# Patient Record
Sex: Female | Born: 1950 | Race: Black or African American | Hispanic: No | Smoking: Never smoker
Health system: Southern US, Community
[De-identification: ages and names within clinical notes are randomized; demographics above are authoritative.]

## PROBLEM LIST (undated history)

## (undated) DIAGNOSIS — E049 Nontoxic goiter, unspecified: Secondary | ICD-10-CM

## (undated) DIAGNOSIS — R001 Bradycardia, unspecified: Secondary | ICD-10-CM

## (undated) DIAGNOSIS — I10 Essential (primary) hypertension: Secondary | ICD-10-CM

## (undated) HISTORY — PX: TUBAL LIGATION: SHX77

## (undated) HISTORY — PX: CHOLECYSTECTOMY: SHX55

## (undated) HISTORY — PX: TONSILLECTOMY: SUR1361

---

## 2001-05-26 ENCOUNTER — Inpatient Hospital Stay (HOSPITAL_COMMUNITY): Admission: EM | Admit: 2001-05-26 | Discharge: 2001-05-28 | Payer: Self-pay | Admitting: Emergency Medicine

## 2001-05-26 ENCOUNTER — Encounter (INDEPENDENT_AMBULATORY_CARE_PROVIDER_SITE_OTHER): Payer: Self-pay | Admitting: *Deleted

## 2001-05-26 ENCOUNTER — Emergency Department (HOSPITAL_COMMUNITY): Admission: EM | Admit: 2001-05-26 | Discharge: 2001-05-26 | Payer: Self-pay | Admitting: Emergency Medicine

## 2001-05-26 ENCOUNTER — Encounter: Payer: Self-pay | Admitting: Internal Medicine

## 2006-02-22 ENCOUNTER — Encounter: Admission: RE | Admit: 2006-02-22 | Discharge: 2006-02-22 | Payer: Self-pay | Admitting: Internal Medicine

## 2007-05-08 ENCOUNTER — Encounter: Admission: RE | Admit: 2007-05-08 | Discharge: 2007-05-08 | Payer: Self-pay | Admitting: Internal Medicine

## 2008-05-08 ENCOUNTER — Encounter: Admission: RE | Admit: 2008-05-08 | Discharge: 2008-05-08 | Payer: Self-pay | Admitting: Internal Medicine

## 2009-05-26 ENCOUNTER — Encounter: Admission: RE | Admit: 2009-05-26 | Discharge: 2009-05-26 | Payer: Self-pay | Admitting: Gynecology

## 2010-05-27 ENCOUNTER — Encounter: Admission: RE | Admit: 2010-05-27 | Discharge: 2010-05-27 | Payer: Self-pay | Admitting: Gynecology

## 2011-01-29 NOTE — H&P (Signed)
[place]. [facility]  Patient:    Anna Gutierrez, Anna Gutierrez Visit Number: [id number] MRN: [id number]          Service Type: MED Location: [id number] [phone] Attending Physician:  Wilson Singer Dictated by:   Jimmye Norman, M.D. Admit Date:  05/26/2001   CC:         Lilly Cove, M.D.   History and Physical  PATIENT IDENTIFICATION AND CHIEF COMPLAINT:  The patient is a 60 year old woman with acute cholecystitis.  HISTORY OF PRESENT ILLNESS:  The patient was admitted by Dr. Karilyn Cota after an ultrasound done because of acute right upper quadrant pain demonstrated acute cholecystitis with a dilated common bile duct and gallstones.  A surgical consultation was obtained.  PAST MEDICAL HISTORY:  Hypertension for which she takes hydrochlorothiazide and potassium   supplement.  In spite of that, the patient is hypokalemic at 2.7.  PAST SURGICAL HISTORY:  She has had a tubal ligation.  That is all that she can remember otherwise.  REVIEW OF SYSTEMS:  She is healthy.  She has had no diarrhea, constipation, no jaundice.  PHYSICAL EXAMINATION:  VITAL SIGNS:  She is currently afebrile.  Other vital signs are stable.  Her white count is elevated at 13,800, hematocrit 42%.  Potassium 2.7.  ABDOMEN:  Soft.  She has tenderness in her right upper quadrant which is improved from previously when she was initially seen.  She has mild guarding but no rebound, no peritonitis.  IMPRESSION: 1. Acute cholecystitis and cholelithiasis. 2. Hypokalemia.  PLAN:  Replace her potassium and prepare her for surgical intervention tomorrow. Dictated by:   Jimmye Norman, M.D. Attending Physician:  Wilson Singer DD:  05/26/01 TD:  05/26/01 Job: [id number]

## 2011-01-29 NOTE — H&P (Signed)
[name]. [facility]  Patient:    Anna Gutierrez, Anna Gutierrez Visit Number: [id number] MRN: [id number]          Service Type: Attending:  Lilly Cove, M.D. Dictated by:   Lilly Cove, M.D.                           History and Physical  DATE OF BIRTH:  April 25, 1931.  HISTORY OF PRESENT ILLNESS:  This is a 60 year old African-American lady who has a background of hypertension who presented with a one-day history of epigastric pain which radiated more to the left side of the abdomen and upper chest.  She went to the emergency room yesterday but waited for four to five hours without being seen by a physician and decided that she should come home. She came to my office this morning and presented with these symptoms. There was no hematemesis or melena.  There was no nausea or vomiting.  PAST MEDICAL HISTORY:  Hypertension as mentioned above.  No serious operations or illnesses.  MEDICATIONS: 1. Hydrochlorothiazide 50 mg q.d. 2. Potassium chloride 14 mEq b.i.d.  SOCIAL HISTORY:  She does not smoke and does not drink excessive alcohol.  FAMILY HISTORY:  Noncontributory.  REVIEW OF SYSTEMS:  Apart from the symptoms mentioned above, there are no other symptoms referable to the cardiovascular, respiratory, musculoskeletal, neurologic, endocrine, skin, or genitourinary systems.  PHYSICAL EXAMINATION:  GENERAL APPEARANCE:  She does not look jaundice.  She does not look clinically anemic.  VITAL SIGNS:  She is afebrile.  Blood pressure 104/80 in the office, pulse 80 per minute.  LUNGS:  Clear.  CARDIOVASCULAR:  Heart sounds are present and normal with no murmurs or added sounds.  ABDOMEN:  She is tender in the right upper quadrant with positive Murphys sign.  Her abdomen is otherwise soft but bowel sounds are scanty.  NEUROLOGICAL:  Unremarkable and there were no focal neurological signs.  INVESTIGATIONS:  An ultrasound of the abdomen has been done in  the ultrasound department at [facility] and the verbal report is one of acute cholecystitis with cholelithiasis and dilatation of biliary duct system.  IMPRESSION AND PLAN:  Acute cholecystitis and gallstones.  We will keep her NPO, give her intravenous fluids and intravenous analgesia as well as antiemetics as required.  Intravenous Unasyn 1.5 g q.6h. will be started. Further lab work is pending including an amylase and lipase.  Blood cultures will be obtained.  I have asked the surgeons to come and consult on her as she will very likely need a cholecystectomy.  Further recommendations will dependent on the patients progress. Dictated by:   Lilly Cove, M.D. Attending:  Lilly Cove, M.D. DD:  05/26/01 TD:  05/26/01 Job: [id number]

## 2011-01-29 NOTE — Op Note (Signed)
[place]. [facility]  Patient:    LEMOYNE, SCARPATI Visit Number: [id number] MRN: [id number]          Service Type: MED Location: 5700 [phone] Attending Physician:  Wilson Singer Dictated by:   Jimmye Norman, M.D. Proc. Date: 05/27/01 Admit Date:  05/26/2001   CC:         Lilly Cove, M.D.   Operative Report  PREOPERATIVE DIAGNOSIS:  Acute cholecystitis and cholelithiasis.  POSTOPERATIVE DIAGNOSIS:  Acute cholecystitis and cholelithiasis.  PROCEDURE:  Laparoscopic cholecystectomy.  SURGEON:  Jimmye Norman, M.D.  ASSISTANT:  Sandria Bales. Ezzard Standing, M.D.  ANESTHESIA:  General endotracheal.  ESTIMATED BLOOD LOSS:  100 to 200 cc.  COMPLICATIONS:  None.  CONDITION:  Stable.  INDICATIONS FOR PROCEDURE:  The patient is a 60 year old woman with hypertension, otherwise healthy.  Comes in with acute right upper quadrant pain, and ultrasound demonstrating acute cholecystitis.  FINDINGS:  The patient had an acutely inflamed gallbladder with adhesions to the surrounding omentum, duodenum, and stomach.  No cholangiogram was done. There was thick bile and large stones contained within the gallbladder itself which was markedly distended and likely with an obstructed cystic duct.  DESCRIPTION OF PROCEDURE:  The patient was taken to the operating room, placed on the table in supine position.  After an adequate general anesthetic was administered, she was prepped and draped in usual sterile manner with Duraprep and placed exposing the right upper quadrant and midline of the abdomen.  The patient was placed in reverse Trendelenburg position.  A supraumbilical incision was made using a #11 blade and taken down to the midline fascia.  It was through this midline fascia that a Veress needle was passed into the perineal cavity while tenting up on the anterior abdominal wall, and confirmed to be in position with the saline tents.  Once this was done, carbon  dioxide insufflation was instilled into the peritoneal cavity up to a maximum intra-abdominal pressure of 15 mmHg.  When this was done a 10-11 mm cannula was passed through the supraumbilical fascia into the peritoneal cavity, and confirmed to be in position with the laparoscope and attached camera and light source.  With that in place, two right costal margins 5 mm cannulas in the subxiphoid, a 10-11 mm cannula passed into the peritoneal cavity under direct vision.  The patient was placed in steeper reversed Trendelenburg position. Her left side was tilted down, and the dissection begun.  As mentioned previously, there were adhesions to the gallbladder wall, the body, and the infundibulum.  These were taken down using blunt dissection.  We were then able to decompress the gallbladder with a large decompressive needle through the medial 5 mm cannula.  Once this was done, we were able to grasp the gallbladder well and retracted towards the right upper quadrant.  During the process the infundibulum was exposed, ______ separately, and also used it as a point where we could fold from and dissect out the cystic duct and cystic artery in the hepatoduodenal triangle, and the triangle of Fallot.  Both the cystic duct and cystic artery were identified and clipped proximally and distally with leaf double clips.  We then transected both structures, and dissected the gallbladder off of its bed using electrocautery.  This portion was somewhat bloody because the intense inflammation of the gallbladder.  It also should be noted that at her cannula sites, the patient did tend to bleed more excessively than usual from all incisions made in the skin.  Once the gallbladder was removed from its bed, we passed it into an endocatch bag and brought it out through the supraumbilical fascia.  We did have to open the gallbladder to do this.  Once this was done, we allowed all gas to escape from the cannulas after  irrigating with 3 L of warm saline solution.  Once we allowed all gas to escape, we reapproximated the supraumbilical fascia using a figure-of-eight stitch of 0 Vicryl, passed on an UR6 needle.  Marcaine 0.25% with epinephrine was injected in all skin sites.  Once this was done, the skin was reapproximated using a running subcuticular stitch of 4-0 Vicryl.  Sterile dressings were applied. Dictated by:   Jimmye Norman, M.D. Attending Physician:  Wilson Singer DD:  05/27/01 TD:  05/27/01 Job: [id number]

## 2011-04-19 ENCOUNTER — Other Ambulatory Visit: Payer: Self-pay | Admitting: Internal Medicine

## 2011-04-19 DIAGNOSIS — Z1231 Encounter for screening mammogram for malignant neoplasm of breast: Secondary | ICD-10-CM

## 2011-05-31 ENCOUNTER — Ambulatory Visit
Admission: RE | Admit: 2011-05-31 | Discharge: 2011-05-31 | Disposition: A | Discharge status: Home / Self Care | Payer: BC Managed Care – PPO | Source: Ambulatory Visit | Attending: Internal Medicine | Admitting: Internal Medicine

## 2011-05-31 DIAGNOSIS — Z1231 Encounter for screening mammogram for malignant neoplasm of breast: Secondary | ICD-10-CM

## 2012-05-05 ENCOUNTER — Other Ambulatory Visit: Payer: Self-pay | Admitting: Gynecology

## 2012-05-05 DIAGNOSIS — Z1231 Encounter for screening mammogram for malignant neoplasm of breast: Secondary | ICD-10-CM

## 2012-06-01 ENCOUNTER — Ambulatory Visit
Admission: RE | Admit: 2012-06-01 | Discharge: 2012-06-01 | Disposition: A | Discharge status: Home / Self Care | Payer: BC Managed Care – PPO | Source: Ambulatory Visit | Attending: Gynecology | Admitting: Gynecology

## 2012-06-01 DIAGNOSIS — Z1231 Encounter for screening mammogram for malignant neoplasm of breast: Secondary | ICD-10-CM

## 2012-12-07 ENCOUNTER — Other Ambulatory Visit: Payer: Self-pay | Admitting: Gynecology

## 2013-07-06 ENCOUNTER — Other Ambulatory Visit: Payer: Self-pay

## 2013-07-06 DIAGNOSIS — Z1231 Encounter for screening mammogram for malignant neoplasm of breast: Secondary | ICD-10-CM

## 2013-07-26 ENCOUNTER — Ambulatory Visit
Admission: RE | Admit: 2013-07-26 | Discharge: 2013-07-26 | Disposition: A | Discharge status: Home / Self Care | Payer: BC Managed Care – PPO | Source: Ambulatory Visit

## 2013-07-26 DIAGNOSIS — Z1231 Encounter for screening mammogram for malignant neoplasm of breast: Secondary | ICD-10-CM

## 2014-06-27 ENCOUNTER — Other Ambulatory Visit: Payer: Self-pay

## 2014-06-27 DIAGNOSIS — Z1239 Encounter for other screening for malignant neoplasm of breast: Secondary | ICD-10-CM

## 2014-07-26 ENCOUNTER — Other Ambulatory Visit: Payer: Self-pay

## 2014-07-26 DIAGNOSIS — Z1231 Encounter for screening mammogram for malignant neoplasm of breast: Secondary | ICD-10-CM

## 2014-07-29 ENCOUNTER — Ambulatory Visit
Admission: RE | Admit: 2014-07-29 | Discharge: 2014-07-29 | Disposition: A | Discharge status: Home / Self Care | Payer: BC Managed Care – PPO | Source: Ambulatory Visit

## 2014-07-29 ENCOUNTER — Encounter (INDEPENDENT_AMBULATORY_CARE_PROVIDER_SITE_OTHER): Payer: Self-pay

## 2014-07-29 DIAGNOSIS — Z1231 Encounter for screening mammogram for malignant neoplasm of breast: Secondary | ICD-10-CM

## 2014-09-13 HISTORY — PX: RECTAL PROLAPSE REPAIR: SHX759

## 2014-11-16 DIAGNOSIS — K623 Rectal prolapse: Secondary | ICD-10-CM | POA: Insufficient documentation

## 2015-05-22 DIAGNOSIS — R159 Full incontinence of feces: Secondary | ICD-10-CM | POA: Insufficient documentation

## 2015-06-23 ENCOUNTER — Other Ambulatory Visit: Payer: Self-pay

## 2015-06-23 DIAGNOSIS — Z1231 Encounter for screening mammogram for malignant neoplasm of breast: Secondary | ICD-10-CM

## 2015-08-01 ENCOUNTER — Ambulatory Visit
Admission: RE | Admit: 2015-08-01 | Discharge: 2015-08-01 | Disposition: A | Discharge status: Home / Self Care | Payer: BC Managed Care – PPO | Source: Ambulatory Visit

## 2015-08-01 DIAGNOSIS — Z1231 Encounter for screening mammogram for malignant neoplasm of breast: Secondary | ICD-10-CM

## 2016-07-08 ENCOUNTER — Other Ambulatory Visit: Payer: Self-pay | Admitting: Internal Medicine

## 2016-07-08 DIAGNOSIS — Z1231 Encounter for screening mammogram for malignant neoplasm of breast: Secondary | ICD-10-CM

## 2016-08-04 ENCOUNTER — Ambulatory Visit
Admission: RE | Admit: 2016-08-04 | Discharge: 2016-08-04 | Disposition: A | Discharge status: Home / Self Care | Payer: BC Managed Care – PPO | Source: Ambulatory Visit | Attending: Internal Medicine | Admitting: Internal Medicine

## 2016-08-04 DIAGNOSIS — Z1231 Encounter for screening mammogram for malignant neoplasm of breast: Secondary | ICD-10-CM

## 2017-03-16 ENCOUNTER — Observation Stay (HOSPITAL_COMMUNITY): Payer: BC Managed Care – PPO

## 2017-03-16 ENCOUNTER — Encounter (HOSPITAL_COMMUNITY): Payer: Self-pay | Admitting: Emergency Medicine

## 2017-03-16 ENCOUNTER — Inpatient Hospital Stay (HOSPITAL_COMMUNITY)
Admission: EM | Admit: 2017-03-16 | Discharge: 2017-03-18 | DRG: 244 | Disposition: A | Discharge status: Home / Self Care | Payer: BC Managed Care – PPO | Attending: Family Medicine | Admitting: Family Medicine

## 2017-03-16 DIAGNOSIS — R001 Bradycardia, unspecified: Secondary | ICD-10-CM | POA: Diagnosis not present

## 2017-03-16 DIAGNOSIS — I10 Essential (primary) hypertension: Secondary | ICD-10-CM | POA: Diagnosis not present

## 2017-03-16 DIAGNOSIS — R55 Syncope and collapse: Secondary | ICD-10-CM | POA: Diagnosis not present

## 2017-03-16 DIAGNOSIS — Z8249 Family history of ischemic heart disease and other diseases of the circulatory system: Secondary | ICD-10-CM

## 2017-03-16 DIAGNOSIS — Z95818 Presence of other cardiac implants and grafts: Secondary | ICD-10-CM

## 2017-03-16 DIAGNOSIS — E0500 Thyrotoxicosis with diffuse goiter without thyrotoxic crisis or storm: Secondary | ICD-10-CM | POA: Diagnosis present

## 2017-03-16 DIAGNOSIS — R159 Full incontinence of feces: Secondary | ICD-10-CM | POA: Diagnosis present

## 2017-03-16 DIAGNOSIS — I491 Atrial premature depolarization: Secondary | ICD-10-CM | POA: Diagnosis present

## 2017-03-16 DIAGNOSIS — K623 Rectal prolapse: Secondary | ICD-10-CM | POA: Diagnosis present

## 2017-03-16 DIAGNOSIS — Z79899 Other long term (current) drug therapy: Secondary | ICD-10-CM

## 2017-03-16 DIAGNOSIS — I959 Hypotension, unspecified: Secondary | ICD-10-CM | POA: Diagnosis present

## 2017-03-16 DIAGNOSIS — E0590 Thyrotoxicosis, unspecified without thyrotoxic crisis or storm: Secondary | ICD-10-CM

## 2017-03-16 HISTORY — DX: Bradycardia, unspecified: R00.1

## 2017-03-16 HISTORY — DX: Nontoxic goiter, unspecified: E04.9

## 2017-03-16 HISTORY — DX: Essential (primary) hypertension: I10

## 2017-03-16 LAB — BASIC METABOLIC PANEL
Anion gap: 9 (ref 5–15)
BUN: 7 mg/dL (ref 6–20)
CALCIUM: 9.6 mg/dL (ref 8.9–10.3)
CO2: 23 mmol/L (ref 22–32)
CREATININE: 0.79 mg/dL (ref 0.44–1.00)
Chloride: 107 mmol/L (ref 101–111)
GFR calc Af Amer: >60 mL/min (ref >60)
GLUCOSE: 106 mg/dL — AB (ref 65–99)
POTASSIUM: 4.3 mmol/L (ref 3.5–5.1)
Sodium: 139 mmol/L (ref 135–145)

## 2017-03-16 LAB — CBC WITH DIFFERENTIAL/PLATELET
Basophils Absolute: 0 10*3/uL (ref 0.0–0.1)
Basophils Relative: 0 %
EOS ABS: 0.2 10*3/uL (ref 0.0–0.7)
EOS PCT: 2 %
HCT: 42.6 % (ref 36.0–46.0)
Hemoglobin: 14 g/dL (ref 12.0–15.0)
LYMPHS ABS: 1.8 10*3/uL (ref 0.7–4.0)
LYMPHS PCT: 19 %
MCH: 28.3 pg (ref 26.0–34.0)
MCHC: 32.9 g/dL (ref 30.0–36.0)
MCV: 86.2 fL (ref 78.0–100.0)
MONO ABS: 0.8 10*3/uL (ref 0.1–1.0)
MONOS PCT: 9 %
Neutro Abs: 6.6 10*3/uL (ref 1.7–7.7)
Neutrophils Relative %: 70 %
PLATELETS: 302 10*3/uL (ref 150–400)
RBC: 4.94 MIL/uL (ref 3.87–5.11)
RDW: 13.8 % (ref 11.5–15.5)
WBC: 9.3 10*3/uL (ref 4.0–10.5)

## 2017-03-16 LAB — I-STAT TROPONIN, ED: Troponin i, poc: 0 ng/mL (ref 0.00–0.08)

## 2017-03-16 LAB — SEDIMENTATION RATE: SED RATE: 26 mm/h — AB (ref 0–22)

## 2017-03-16 LAB — URINALYSIS, ROUTINE W REFLEX MICROSCOPIC
Bilirubin Urine: NEGATIVE
GLUCOSE, UA: NEGATIVE mg/dL
Hgb urine dipstick: NEGATIVE
Ketones, ur: NEGATIVE mg/dL
LEUKOCYTES UA: NEGATIVE
Nitrite: NEGATIVE
PH: 6 (ref 5.0–8.0)
PROTEIN: NEGATIVE mg/dL
Specific Gravity, Urine: 1.008 (ref 1.005–1.030)

## 2017-03-16 LAB — RAPID URINE DRUG SCREEN, HOSP PERFORMED
Amphetamines: NOT DETECTED
BARBITURATES: NOT DETECTED
BENZODIAZEPINES: NOT DETECTED
COCAINE: NOT DETECTED
Opiates: NOT DETECTED
Tetrahydrocannabinol: NOT DETECTED

## 2017-03-16 LAB — TSH

## 2017-03-16 LAB — MAGNESIUM: MAGNESIUM: 2.2 mg/dL (ref 1.7–2.4)

## 2017-03-16 LAB — T4, FREE: Free T4: 1.81 ng/dL — ABNORMAL HIGH (ref 0.61–1.12)

## 2017-03-16 LAB — POC OCCULT BLOOD, ED: Fecal Occult Bld: NEGATIVE

## 2017-03-16 LAB — TROPONIN I
Troponin I: <0.03 ng/mL (ref <0.03)
Troponin I: <0.03 ng/mL (ref <0.03)

## 2017-03-16 MED ORDER — BENAZEPRIL HCL 10 MG PO TABS
10.0000 mg | ORAL_TABLET | Freq: Every day | ORAL | Status: DC
  Filled 2017-03-16 (×2): qty 1

## 2017-03-16 MED ORDER — ONDANSETRON HCL 4 MG PO TABS: 4.0000 mg | ORAL_TABLET | Freq: Four times a day (QID) | ORAL | Status: DC | PRN

## 2017-03-16 MED ORDER — SODIUM CHLORIDE 0.9% FLUSH
3.0000 mL | Freq: Two times a day (BID) | INTRAVENOUS | Status: DC
  Administered 2017-03-16 – 2017-03-18 (×5): 3 mL via INTRAVENOUS

## 2017-03-16 MED ORDER — ACETAMINOPHEN 325 MG PO TABS
650.0000 mg | ORAL_TABLET | Freq: Four times a day (QID) | ORAL | Status: DC | PRN
  Administered 2017-03-16: 650 mg via ORAL
  Filled 2017-03-16: qty 2

## 2017-03-16 MED ORDER — ENOXAPARIN SODIUM 40 MG/0.4ML ~~LOC~~ SOLN
40.0000 mg | SUBCUTANEOUS | Status: DC
  Administered 2017-03-16: 40 mg via SUBCUTANEOUS
  Filled 2017-03-16: qty 0.4

## 2017-03-16 MED ORDER — AMLODIPINE BESY-BENAZEPRIL HCL 5-10 MG PO CAPS: 1.0000 | ORAL_CAPSULE | Freq: Every day | ORAL | Status: DC

## 2017-03-16 MED ORDER — ACETAMINOPHEN 650 MG RE SUPP: 650.0000 mg | Freq: Four times a day (QID) | RECTAL | Status: DC | PRN

## 2017-03-16 MED ORDER — DOBUTAMINE IN D5W 4-5 MG/ML-% IV SOLN
2.5000 ug/kg/min | INTRAVENOUS | Status: DC
  Administered 2017-03-16: 2.5 ug/kg/min via INTRAVENOUS
  Filled 2017-03-16: qty 250

## 2017-03-16 MED ORDER — SODIUM CHLORIDE 0.9 % IV BOLUS (SEPSIS)
1000.0000 mL | Freq: Once | INTRAVENOUS | Status: AC
  Administered 2017-03-16: 1000 mL via INTRAVENOUS

## 2017-03-16 MED ORDER — SODIUM CHLORIDE 0.9 % IV SOLN
INTRAVENOUS | Status: DC
  Administered 2017-03-16: 12:00:00 via INTRAVENOUS

## 2017-03-16 MED ORDER — ONDANSETRON HCL 4 MG/2ML IJ SOLN: 4.0000 mg | Freq: Four times a day (QID) | INTRAMUSCULAR | Status: DC | PRN

## 2017-03-16 MED ORDER — AMLODIPINE BESYLATE 5 MG PO TABS
5.0000 mg | ORAL_TABLET | Freq: Every day | ORAL | Status: DC
  Filled 2017-03-16: qty 1

## 2017-03-16 NOTE — ED Provider Notes (Signed)
[facility]-EMERGENCY DEPT Provider Note   CSN: [id number] Arrival date & time: 03/16/17  0815     History   Chief Complaint Chief Complaint  Patient presents with  . Dizziness  . Weakness    HPI Anna Gutierrez is a 66 y.o. female.  HPI  Patient presents to ED for complaints of lightheadedness and generalized weakness for the past 2 days that has worsened this morning. She states that when she was walking her mailbox she is unsure if she lost consciousness or hit her head. She also reports taking a home Hemoccult stool test 1 month ago which came out positive. She states that she is scheduled to see a GI specialist in September and could not get in any earlier. She denies any gross blood in her stools, abdominal pain, nausea or vomiting. She states she is compliant with her amlodipine and vitamin D daily. Denies any recent medication changes or additions. She denies any numbness, weakness or trouble walking.  History reviewed. No pertinent past medical history.  There are no active problems to display for this patient.   History reviewed. No pertinent surgical history.  OB History    No data available       Home Medications    Prior to Admission medications   Not on File    Family History History reviewed. No pertinent family history.  Social History Social History  Substance Use Topics  . Smoking status: Never Smoker  . Smokeless tobacco: Never Used  . Alcohol use No     Allergies   Patient has no known allergies.   Review of Systems Review of Systems  Constitutional: Negative for appetite change, chills and fever.  HENT: Negative for ear pain, rhinorrhea, sneezing and sore throat.   Eyes: Negative for photophobia and visual disturbance.  Respiratory: Negative for cough, chest tightness, shortness of breath and wheezing.   Cardiovascular: Negative for chest pain and palpitations.  Gastrointestinal: Positive for blood in stool. Negative for abdominal pain,  constipation, diarrhea, nausea and vomiting.  Genitourinary: Negative for dysuria, hematuria and urgency.  Musculoskeletal: Negative for myalgias.  Skin: Negative for rash.  Neurological: Positive for dizziness, weakness and light-headedness. Negative for facial asymmetry, speech difficulty, numbness and headaches.     Physical Exam Updated Vital Signs BP (!) 142/62 (BP Location: Left Arm)   Pulse 74   Temp 98.2 F (36.8 C) (Oral)   Resp 16   Ht 5\' 5"  (1.651 m)   Wt 72.1 kg (159 lb)   SpO2 99%   BMI 26.46 kg/m   Physical Exam  Constitutional: She is oriented to person, place, and time. She appears well-developed and well-nourished. No distress.  HENT:  Head: Normocephalic and atraumatic.  Nose: Nose normal.  Eyes: Conjunctivae and EOM are normal. Pupils are equal, round, and reactive to light. Right eye exhibits no discharge. Left eye exhibits no discharge. No scleral icterus.  Neck: Normal range of motion. Neck supple.  Cardiovascular: Normal rate, regular rhythm, normal heart sounds and intact distal pulses.  Exam reveals no gallop and no friction rub.   No murmur heard. Pulmonary/Chest: Effort normal and breath sounds normal. No respiratory distress.  Abdominal: Soft. Bowel sounds are normal. She exhibits no distension. There is no tenderness. There is no guarding.  Genitourinary: Rectum normal. Rectal exam shows no fissure and guaiac negative stool.  Genitourinary Comments: Chaperone present throughout entirety of rectal exam.  Musculoskeletal: Normal range of motion. She exhibits no edema.  Neurological: She is alert  and oriented to person, place, and time. No cranial nerve deficit or sensory deficit. She exhibits normal muscle tone. Coordination normal.  Strength 5/5 in bilateral upper and lower extremities. Cranial nerves appear grossly intact. Negative HINTS exam. No facial asymmetry noted. 2+ DP pulses and radial pulses bilaterally.   Skin: Skin is warm and dry. No rash  noted.  Psychiatric: She has a normal mood and affect.  Nursing note and vitals reviewed.    ED Treatments / Results  Labs (all labs ordered are listed, but only abnormal results are displayed) Labs Reviewed  CBC WITH DIFFERENTIAL/PLATELET  BASIC METABOLIC PANEL  POC OCCULT BLOOD, ED  I-STAT TROPOININ, ED    EKG  EKG Interpretation  Date/Time:  Wednesday March 16 2017 09:26:43 EDT Ventricular Rate:  35 PR Interval:    QRS Duration: 105 QT Interval:  517 QTC Calculation: 395 R Axis:   75 Text Interpretation:  Sinus bradycardia Atrial premature complexes No old tracing to compare Confirmed by Doug Sou [phone]) on 03/16/2017 9:32:10 AM       Radiology No results found.  Procedures Procedures (including critical care time)  Medications Ordered in ED Medications  sodium chloride 0.9 % bolus 1,000 mL (1,000 mLs Intravenous New Bag/Given 03/16/17 0945)     Initial Impression / Assessment and Plan / ED Course  I have reviewed the triage vital signs and the nursing notes.  Pertinent labs & imaging results that were available during my care of the patient were reviewed by me and considered in my medical decision making (see chart for details).     Patient presents to ED for lightheadedness and generalized weakness that began 2 days ago and has worsened this morning. Patient reports brief episode of what appears to be loss of consciousness while walking to her mailbox earlier today, unsure of head injury or loss of consciousness. States that she was scheduled for her colonoscopy last month but instead elected to do at home Hemoccult testing which returned as positive. She is scheduled for her GI appointment in September. On physical exam patient appears pleasant and does not appear in acute distress. There are no focal findings on neurological exam. She has no abdominal tenderness. Vital signs appear normal with the exception of heart rate. Patient placed on 5-lead monitor due  to bradycardia down to the 30s to 40s with irregular rhythm. She denies any previous history of heart issues or arrhythmias. She reports compliance with her home amlodipine and vitamin D. She denies any other home medication use. CBC, BMP unremarkable. Troponin negative 1. Hemoccult stool test negative here in the ED. There are no physical findings suggestive of fissure or tear on physical exam. She will need to be admitted for further evaluation and possible pacemaker placement as warranted. Spoke to hospitalist who will admit the patient. Spoke to Dr. Tenny Craw from cardiology who recommended that we obtain TSH and admit patient for further management. I appreciate both of their help for this patient.  Patient discussed with and seen by Dr. Ethelda Chick.  Final Clinical Impressions(s) / ED Diagnoses   Final diagnoses:  None    New Prescriptions New Prescriptions   No medications on file     Dietrich Pates, PA-C 03/16/17 1738    Doug Sou, MD 03/16/17 1739

## 2017-03-16 NOTE — Progress Notes (Signed)
Pt ambulated in a hallway without having any difficulty and SOB

## 2017-03-16 NOTE — ED Notes (Addendum)
Pt. Placed on 5-lead due to bradycardia on pulse ox. Pt. Brady at 30 bpm with irregular rhythm. EKG shot. EDP made aware.   RN attempted 1x IV in right forearm unsuccessfully.

## 2017-03-16 NOTE — Significant Event (Signed)
Patient had a 3.39 s pause. Currently stable. She was asymptomatic. Will monitor. If repeat pause, will need to start on dopamin/dobutamine drip.

## 2017-03-16 NOTE — Progress Notes (Signed)
Cardiology paged informed that patient has had several pauses. The greatest was 3.5 New orders received. Ilean SkillVeronica Estle Huguley LPN

## 2017-03-16 NOTE — H&P (Signed)
History and Physical    Anna Gutierrez:[id number] DOB: 02/13/1951 DOA: 03/16/2017   PCP: Ralene Ok, MD   Patient coming from:  Home    Chief Complaint: Dizziness, generalized weakness  HPI: Anna Gutierrez is a 66 y.o. female  with a history of hypertension, rectal prolapse followed at [facility], presenting to the ED with 2 day history of generalized weakness, lightheadedness with one episode of presyncope as she was to reach an object on the floor. She denies syncope. Denies any vision changes, headaches. No dysarthria or dysphagia. Denies any unilateral weakness or sensory deficiencies. Denies head trauma, confusion or seizures. Denies any chest pain, palpitations, or shortness of breath. Denies any fever or chills, or night sweats. Denies any abdominal pain. She has chronic incontinence of stool  Denies any sick contacts or new foods. Denies any recent long distance trips. No recent surgeries. Denies lower extremity swelling. Denies abnormal skin rashes, or neuropathy. Compliant with her medications. Last BP meds taken last night. No tobacco, alcohol or recreational drugs. She has family history of cardiac disease in her mother. She has never been seen by cardiology. Of note, her PCP had mentioned to her that she had "skipped heartbeats" during her last exam as OP  ED Course:  BP 97/65   Pulse (!) 51   Temp 98.2 F (36.8 C) (Oral)   Resp 17   Ht 5\' 5"  (1.651 m)   Wt 72.1 kg (159 lb)   SpO2 96%   BMI 26.46 kg/m    CBC and CMET normal  CO2 23 received 1 L 05 fluids at the ER EKG SBrady with atrial premature complexes Trop 0  Review of Systems:  As per HPI otherwise all other systems reviewed and are negative  History reviewed. No pertinent past medical history.  History reviewed. No pertinent surgical history.  Social History Social History   Social History  . Marital status: Married    Spouse name: N/A  . Number of children: N/A  . Years of education: N/A    Occupational History  . Not on file.   Social History Main Topics  . Smoking status: Never Smoker  . Smokeless tobacco: Never Used  . Alcohol use No  . Drug use: No  . Sexual activity: Not on file   Other Topics Concern  . Not on file   Social History Narrative  . No narrative on file     No Known Allergies  History reviewed. No pertinent family history.    Prior to Admission medications   Medication Sig Start Date End Date Taking? Authorizing Provider  amLODipine-benazepril (LOTREL) 5-10 MG capsule Take 1 capsule by mouth daily. 01/18/17  Yes [provider]  Cholecalciferol (VITAMIN D PO) Take 1 tablet by mouth daily.   Yes [provider]  Loperamide HCl (IMODIUM A-D PO) Take 1 capsule by mouth daily as needed (loose stool).   Yes [provider]    Physical Exam:  Vitals:   03/16/17 1000 03/16/17 1015 03/16/17 1040 03/16/17 1100  BP: (!) 112/57 114/62  97/65  Pulse: 64 67 (!) 53 (!) 51  Resp: 18 17 19 17   Temp:      TempSrc:      SpO2: 100% 97% 98% 96%  Weight:      Height:       Constitutional: NAD, calm, comfortable   Eyes: PERRL, lids and conjunctivae normal ENMT: Mucous membranes are moist, without exudate or lesions  Neck: normal, supple, no  masses, no thyromegaly Respiratory: clear to auscultation bilaterally, no wheezing, no crackles. Normal respiratory effort  Cardiovascular: Huston FoleyBrady rate and rhythm with intermittent premature beats , 2/6 systolic murmurs, rubs or gallops. No extremity edema. 2+ pedal pulses. No carotid bruits.  Abdomen: Soft, non tender, No hepatosplenomegaly. Bowel sounds positive.  Musculoskeletal: no clubbing / cyanosis. Moves all extremities Skin: no jaundice, No lesions.  Neurologic: Sensation intact  Strength equal in all extremities Psychiatric:   Alert and oriented x 3. Normal mood.     Labs on Admission: I have personally reviewed following labs and imaging studies  CBC:  Recent Labs Lab  03/16/17 0937  WBC 9.3  NEUTROABS 6.6  HGB 14.0  HCT 42.6  MCV 86.2  PLT 302    Basic Metabolic Panel:  Recent Labs Lab 03/16/17 0937  NA 139  K 4.3  CL 107  CO2 23  GLUCOSE 106*  BUN 7  CREATININE 0.79  CALCIUM 9.6  MG 2.2    GFR: Estimated Creatinine Clearance: 70.7 mL/min (by C-G formula based on SCr of 0.79 mg/dL).  Liver Function Tests: No results for input(s): AST, ALT, ALKPHOS, BILITOT, PROT, ALBUMIN in the last 168 hours. No results for input(s): LIPASE, AMYLASE in the last 168 hours. No results for input(s): AMMONIA in the last 168 hours.  Coagulation Profile: No results for input(s): INR, PROTIME in the last 168 hours.  Cardiac Enzymes: No results for input(s): CKTOTAL, CKMB, CKMBINDEX, TROPONINI in the last 168 hours.  BNP (last 3 results) No results for input(s): PROBNP in the last 8760 hours.  HbA1C: No results for input(s): HGBA1C in the last 72 hours.  CBG: No results for input(s): GLUCAP in the last 168 hours.  Lipid Profile: No results for input(s): CHOL, HDL, LDLCALC, TRIG, CHOLHDL, LDLDIRECT in the last 72 hours.  Thyroid Function Tests: No results for input(s): TSH, T4TOTAL, FREET4, T3FREE, THYROIDAB in the last 72 hours.  Anemia Panel: No results for input(s): VITAMINB12, FOLATE, FERRITIN, TIBC, IRON, RETICCTPCT in the last 72 hours.  Urine analysis: No results found for: COLORURINE, APPEARANCEUR, LABSPEC, PHURINE, GLUCOSEU, HGBUR, BILIRUBINUR, KETONESUR, PROTEINUR, UROBILINOGEN, NITRITE, LEUKOCYTESUR  Sepsis Labs: @LABRCNTIP (procalcitonin:4,lacticidven:4) )No results found for this or any previous visit (from the past 240 hour(s)).   Radiological Exams on Admission: X-ray Chest Pa And Lateral  Result Date: 03/16/2017 CLINICAL DATA:  Patient with lightheadedness.  Generalized weakness. EXAM: CHEST  2 VIEW COMPARISON:  None. FINDINGS: Monitoring leads overlie the patient. Normal cardiac and mediastinal contours. No consolidative  pulmonary opacities. No pleural effusion or pneumothorax. Thoracic spine degenerative changes. IMPRESSION: No acute cardiopulmonary process. Electronically Signed   By: Annia Beltrew  Davis M.D.   On: 03/16/2017 11:22    EKG: Independently reviewed.  Assessment/Plan Active Problems:   Symptomatic bradycardia   Pre-syncope   Hypertension      Presyncope likely due to symptomatic bradycardia . Labs normal, EKG SB with premature atrial complex. TN 0 Neuro exam unremarkable. No changes in her meds  Afebrile  Syncope order set  -admit for observation  - Tele bed. - EEG -2 D echo IV fluids at 100 cc/h  [facility] Serial EKG  CXR   Check Magnesium UDS TSH and T4 Cycle troponin Cardiology consult   Hypertension BP 97/65   Pulse 51  Lotrel's last dose last evening . Currently borderline hypotensive  Continue home anti-hypertensive medications tonight unless BP continues to be low  IVF 100 cc/h    DVT prophylaxis: Lovenox  Code Status:  Full     Family Communication:  Discussed with patient Disposition Plan: Expect patient to be discharged to home after condition improves Consults called:    Cardiology as per EDP  Admission status:Tele   Obs    [facility], PA-C [facility]   03/16/2017, 11:25 AM

## 2017-03-16 NOTE — Progress Notes (Signed)
Cardiology informed that patient had a 3.39 second pause patient was asymptomatic bp 108/43 hr 51. Patient HR has dropped in the upper 30's instructed to call if pauses greater than 3 seconds. Ilean SkillVeronica Sharonlee Nine LPN

## 2017-03-16 NOTE — Consult Note (Signed)
Primary Physician: Primary Cardiologist:  New  Asked to see by Dr Montez Moritaarter for bradycardia    HPI:  Pt is a 66 yo with history of HTN  Presents to ED today with dizziness, weakness, possible syncope The pt says this spring she has had a couple episodes of weakness , dizziness  Didn't think much of it   May be a little more tired   Weakness "comes in waves"   Yesterday she felt dizzy all day  Didn't do a lot  Would use hands to move around house  Laid down  [place] thinking it would pass Today felt dizzy while getting ready in [place]  Had to sit on commode  Got up  Again, used furniture around house to get around   She went out to get paper  Bent over  Then found herself on ground  Think she must have passed out for a short bit  Went back inside  Called Dr QUALCOMMMoriera's office  Told to come t oED   Pt had a friend drive her  On way over had to prop up head at times    FHx is positive for CAD in mother     EKG with SB with PACs  35 bpm        Past Medical History:  Diagnosis Date  . Hypertension   . Symptomatic bradycardia 03/16/2017  . Thyroid goiter    had radioactive iodine for treatment    Medications Prior to Admission  Medication Sig Dispense Refill  . amLODipine-benazepril (LOTREL) 5-10 MG capsule Take 1 capsule by mouth daily.    . Cholecalciferol (VITAMIN D PO) Take 1 tablet by mouth daily.    . Loperamide HCl (IMODIUM A-D PO) Take 1 capsule by mouth daily as needed (loose stool).       Marland Kitchen. amLODipine  5 mg Oral Daily   Or  . benazepril  10 mg Oral Daily  . enoxaparin (LOVENOX) injection  40 mg Subcutaneous Q24H  . sodium chloride flush  3 mL Intravenous Q12H    Infusions: . sodium chloride 100 mL/hr at 03/16/17 1208    No Known Allergies  Social History   Social History  . Marital status: Married    Spouse name: N/A  . Number of children: N/A  . Years of education: N/A   Occupational History  . Not on file.   Social History Main Topics  . Smoking  status: Never Smoker  . Smokeless tobacco: Never Used  . Alcohol use No  . Drug use: No  . Sexual activity: Not on file   Other Topics Concern  . Not on file   Social History Narrative  . No narrative on file    History reviewed. No pertinent family history.  REVIEW OF SYSTEMS:  All systems reviewed  Negative to the above problem except as noted above.    PHYSICAL EXAM: Vitals:   03/16/17 1126 03/16/17 1145  BP:  133/60  Pulse: 63 66  Resp: 16 16  Temp:  98.3 F (36.8 C)     Intake/Output Summary (Last 24 hours) at 03/16/17 1423 Last data filed at 03/16/17 1147  Gross per 24 hour  Intake                0 ml  Output              200 ml  Net             -200  ml    General:  Well appearing. No respiratory difficulty HEENT: normal Neck: supple. no JVD. Carotids 2+ bilat; no bruits. No lymphadenopathy or thryomegaly appreciated. Cor: PMI nondisplaced. Regular rate & rhythm. No rubs, gallops or murmurs. Lungs: clear Abdomen: soft, nontender, nondistended. No hepatosplenomegaly. No bruits or masses. Good bowel sounds. Extremities: no cyanosis, clubbing, rash, edema Neuro: alert & oriented x 3, cranial nerves grossly intact. moves all 4 extremities w/o difficulty. Affect pleasant.  ECG:  Noted above    Results for orders placed or performed during the hospital encounter of 03/16/17 (from the past 24 hour(s))  CBC with Differential     Status: None   Collection Time: 03/16/17  9:37 AM  Result Value Ref Range   WBC 9.3 4.0 - 10.5 K/uL   RBC 4.94 3.87 - 5.11 MIL/uL   Hemoglobin 14.0 12.0 - 15.0 g/dL   HCT 16.1 09.6 - 04.5 %   MCV 86.2 78.0 - 100.0 fL   MCH 28.3 26.0 - 34.0 pg   MCHC 32.9 30.0 - 36.0 g/dL   RDW 40.9 81.1 - 91.4 %   Platelets 302 150 - 400 K/uL   Neutrophils Relative % 70 %   Neutro Abs 6.6 1.7 - 7.7 K/uL   Lymphocytes Relative 19 %   Lymphs Abs 1.8 0.7 - 4.0 K/uL   Monocytes Relative 9 %   Monocytes Absolute 0.8 0.1 - 1.0 K/uL   Eosinophils  Relative 2 %   Eosinophils Absolute 0.2 0.0 - 0.7 K/uL   Basophils Relative 0 %   Basophils Absolute 0.0 0.0 - 0.1 K/uL  Basic metabolic panel     Status: Abnormal   Collection Time: 03/16/17  9:37 AM  Result Value Ref Range   Sodium 139 135 - 145 mmol/L   Potassium 4.3 3.5 - 5.1 mmol/L   Chloride 107 101 - 111 mmol/L   CO2 23 22 - 32 mmol/L   Glucose, Bld 106 (H) 65 - 99 mg/dL   BUN 7 6 - 20 mg/dL   Creatinine, Ser 7.82 0.44 - 1.00 mg/dL   Calcium 9.6 8.9 - 95.6 mg/dL   GFR calc non Af Amer >60 >60 mL/min   GFR calc Af Amer >60 >60 mL/min   Anion gap 9 5 - 15  Magnesium     Status: None   Collection Time: 03/16/17  9:37 AM  Result Value Ref Range   Magnesium 2.2 1.7 - 2.4 mg/dL  I-stat troponin, ED     Status: None   Collection Time: 03/16/17  9:47 AM  Result Value Ref Range   Troponin i, poc 0.00 0.00 - 0.08 ng/mL   Comment 3          POC occult blood, ED Provider will collect     Status: None   Collection Time: 03/16/17  9:49 AM  Result Value Ref Range   Fecal Occult Bld NEGATIVE NEGATIVE  Urinalysis, Routine w reflex microscopic     Status: None   Collection Time: 03/16/17 11:22 AM  Result Value Ref Range   Color, Urine YELLOW YELLOW   APPearance CLEAR CLEAR   Specific Gravity, Urine 1.008 1.005 - 1.030   pH 6.0 5.0 - 8.0   Glucose, UA NEGATIVE NEGATIVE mg/dL   Hgb urine dipstick NEGATIVE NEGATIVE   Bilirubin Urine NEGATIVE NEGATIVE   Ketones, ur NEGATIVE NEGATIVE mg/dL   Protein, ur NEGATIVE NEGATIVE mg/dL   Nitrite NEGATIVE NEGATIVE   Leukocytes, UA NEGATIVE NEGATIVE  Urine rapid drug  screen (hosp performed)     Status: None   Collection Time: 03/16/17 11:22 AM  Result Value Ref Range   Opiates NONE DETECTED NONE DETECTED   Cocaine NONE DETECTED NONE DETECTED   Benzodiazepines NONE DETECTED NONE DETECTED   Amphetamines NONE DETECTED NONE DETECTED   Tetrahydrocannabinol NONE DETECTED NONE DETECTED   Barbiturates NONE DETECTED NONE DETECTED  TSH     Status:  Abnormal   Collection Time: 03/16/17 12:10 PM  Result Value Ref Range   TSH <0.010 (L) 0.350 - 4.500 uIU/mL  T4, free     Status: Abnormal   Collection Time: 03/16/17 12:10 PM  Result Value Ref Range   Free T4 1.81 (H) 0.61 - 1.12 ng/dL  Troponin I     Status: None   Collection Time: 03/16/17 12:10 PM  Result Value Ref Range   Troponin I <0.03 <0.03 ng/mL   X-ray Chest Pa And Lateral  Result Date: 03/16/2017 CLINICAL DATA:  Patient with lightheadedness.  Generalized weakness. EXAM: CHEST  2 VIEW COMPARISON:  None. FINDINGS: Monitoring leads overlie the patient. Normal cardiac and mediastinal contours. No consolidative pulmonary opacities. No pleural effusion or pneumothorax. Thoracic spine degenerative changes. IMPRESSION: No acute cardiopulmonary process. Electronically Signed   By: Annia Belt M.D.   On: 03/16/2017 11:22     ASSESSMENT: Pt is a 66 yo with history of HTN  Now wit hdizziness and syncope  EKG with sinus bradycardia with PACs  QRS complex is narrow. She has not shown signif hypotension here Orthostatic evaluation negative  Thyroid function:  TSH low and T4 sl increased  This would not be expected wit hbradycardia (pt had radioactive thyroid treatment in past.  Confusing) I would continue tele.  Hold antihypertensives (pt takes at night)  Ambulate with assist to follow HR and BP  Echo ordered  I   2  HTN  Follow off of meds  3  Thyroid  Await final results  Will need to be eval with Hx

## 2017-03-16 NOTE — ED Provider Notes (Signed)
comPlains of intermittent lightheadedness onset yesterday morning. She reports that when walking out of her house this morning due to generalized weakness. She denies pain anywhere. No other associated symptoms. On exam alert no distress not pale appearing lungs clear auscultation heart regular rate and rhythm with intermittent periods of bradycardia   Doug SouJacubowitz, Alyson Ki, MD 03/16/17 [phone]

## 2017-03-16 NOTE — ED Notes (Signed)
ED Provider at bedside. 

## 2017-03-16 NOTE — ED Triage Notes (Signed)
Pt here with dizziness and generalized weakness x 2 days; pt sts took at home occult stool test that was positive for blood 1 month ago; pt appears pale

## 2017-03-16 NOTE — Progress Notes (Signed)
New pt admission from ED. Pt brought to the floor in stable condition. Vitals taken. Initial Assessment done. All immediate pertinent needs to patient addressed. Patient Guide given to patient. Important safety instructions relating to hospitalization reviewed with patient. Patient verbalized understanding. Will continue to monitor pt. 

## 2017-03-16 NOTE — Progress Notes (Signed)
Spoke with patient son Gregary SignsSean informed that patient is being transferred to 2H3.Ilean SkillVeronica Calliope Delangel LPN

## 2017-03-17 ENCOUNTER — Encounter (HOSPITAL_COMMUNITY): Payer: Self-pay | Admitting: Family Medicine

## 2017-03-17 ENCOUNTER — Encounter (HOSPITAL_COMMUNITY)
Admission: EM | Disposition: A | Discharge status: Home / Self Care | Payer: Self-pay | Source: Home / Self Care | Attending: Family Medicine

## 2017-03-17 ENCOUNTER — Observation Stay (HOSPITAL_BASED_OUTPATIENT_CLINIC_OR_DEPARTMENT_OTHER): Payer: BC Managed Care – PPO

## 2017-03-17 DIAGNOSIS — Z8249 Family history of ischemic heart disease and other diseases of the circulatory system: Secondary | ICD-10-CM | POA: Diagnosis not present

## 2017-03-17 DIAGNOSIS — R001 Bradycardia, unspecified: Secondary | ICD-10-CM | POA: Diagnosis present

## 2017-03-17 DIAGNOSIS — I491 Atrial premature depolarization: Secondary | ICD-10-CM | POA: Diagnosis present

## 2017-03-17 DIAGNOSIS — Z79899 Other long term (current) drug therapy: Secondary | ICD-10-CM | POA: Diagnosis not present

## 2017-03-17 DIAGNOSIS — E0500 Thyrotoxicosis with diffuse goiter without thyrotoxic crisis or storm: Secondary | ICD-10-CM | POA: Diagnosis present

## 2017-03-17 DIAGNOSIS — R55 Syncope and collapse: Secondary | ICD-10-CM | POA: Diagnosis present

## 2017-03-17 DIAGNOSIS — R159 Full incontinence of feces: Secondary | ICD-10-CM | POA: Diagnosis present

## 2017-03-17 DIAGNOSIS — I10 Essential (primary) hypertension: Secondary | ICD-10-CM

## 2017-03-17 DIAGNOSIS — E0590 Thyrotoxicosis, unspecified without thyrotoxic crisis or storm: Secondary | ICD-10-CM | POA: Diagnosis not present

## 2017-03-17 DIAGNOSIS — I495 Sick sinus syndrome: Secondary | ICD-10-CM

## 2017-03-17 DIAGNOSIS — K623 Rectal prolapse: Secondary | ICD-10-CM | POA: Diagnosis present

## 2017-03-17 DIAGNOSIS — I959 Hypotension, unspecified: Secondary | ICD-10-CM | POA: Diagnosis present

## 2017-03-17 HISTORY — PX: PACEMAKER IMPLANT: EP1218

## 2017-03-17 LAB — ECHOCARDIOGRAM COMPLETE
Height: 65 in
Weight: 2634.94 oz

## 2017-03-17 LAB — TROPONIN I: Troponin I: <0.03 ng/mL (ref <0.03)

## 2017-03-17 LAB — GLUCOSE, CAPILLARY: Glucose-Capillary: 96 mg/dL (ref 65–99)

## 2017-03-17 LAB — MRSA PCR SCREENING: MRSA by PCR: NEGATIVE

## 2017-03-17 SURGERY — PACEMAKER IMPLANT

## 2017-03-17 MED ORDER — LIDOCAINE HCL (PF) 1 % IJ SOLN
INTRAMUSCULAR | Status: DC | PRN
  Administered 2017-03-17: 45 mL

## 2017-03-17 MED ORDER — CEFAZOLIN SODIUM-DEXTROSE 1-4 GM/50ML-% IV SOLN
1.0000 g | Freq: Four times a day (QID) | INTRAVENOUS | Status: AC
  Administered 2017-03-17 – 2017-03-18 (×3): 1 g via INTRAVENOUS
  Filled 2017-03-17 (×3): qty 50

## 2017-03-17 MED ORDER — FENTANYL CITRATE (PF) 100 MCG/2ML IJ SOLN
INTRAMUSCULAR | Status: AC
  Filled 2017-03-17: qty 2

## 2017-03-17 MED ORDER — CHLORHEXIDINE GLUCONATE 4 % EX LIQD
60.0000 mL | Freq: Once | CUTANEOUS | Status: AC
  Filled 2017-03-17: qty 15

## 2017-03-17 MED ORDER — GENTAMICIN SULFATE 40 MG/ML IJ SOLN
INTRAMUSCULAR | Status: AC
  Filled 2017-03-17: qty 2

## 2017-03-17 MED ORDER — MIDAZOLAM HCL 5 MG/5ML IJ SOLN
INTRAMUSCULAR | Status: DC | PRN
  Administered 2017-03-17: 1 mg via INTRAVENOUS
  Administered 2017-03-17: 2 mg via INTRAVENOUS
  Administered 2017-03-17 (×4): 1 mg via INTRAVENOUS

## 2017-03-17 MED ORDER — SODIUM CHLORIDE 0.9 % IV SOLN
INTRAVENOUS | Status: AC | PRN
  Administered 2017-03-17: 20 mL/h via INTRAVENOUS

## 2017-03-17 MED ORDER — SODIUM CHLORIDE 0.9 % IV SOLN
INTRAVENOUS | Status: DC
  Administered 2017-03-17: 10:00:00 via INTRAVENOUS

## 2017-03-17 MED ORDER — CEFAZOLIN SODIUM-DEXTROSE 2-4 GM/100ML-% IV SOLN
INTRAVENOUS | Status: AC
  Filled 2017-03-17: qty 100

## 2017-03-17 MED ORDER — HEPARIN (PORCINE) IN NACL 2-0.9 UNIT/ML-% IJ SOLN
INTRAMUSCULAR | Status: AC | PRN
  Administered 2017-03-17: 500 mL

## 2017-03-17 MED ORDER — CEFAZOLIN SODIUM-DEXTROSE 2-4 GM/100ML-% IV SOLN
2.0000 g | INTRAVENOUS | Status: AC
  Administered 2017-03-17: 2 g via INTRAVENOUS

## 2017-03-17 MED ORDER — CHLORHEXIDINE GLUCONATE 4 % EX LIQD
60.0000 mL | Freq: Once | CUTANEOUS | Status: DC
  Filled 2017-03-17: qty 45

## 2017-03-17 MED ORDER — GENTAMICIN SULFATE 40 MG/ML IJ SOLN
80.0000 mg | INTRAMUSCULAR | Status: AC
  Administered 2017-03-17: 80 mg

## 2017-03-17 MED ORDER — MIDAZOLAM HCL 5 MG/5ML IJ SOLN
INTRAMUSCULAR | Status: AC
  Filled 2017-03-17: qty 5

## 2017-03-17 MED ORDER — LIDOCAINE HCL (PF) 1 % IJ SOLN
INTRAMUSCULAR | Status: AC
  Filled 2017-03-17: qty 60

## 2017-03-17 MED ORDER — HEPARIN (PORCINE) IN NACL 2-0.9 UNIT/ML-% IJ SOLN
INTRAMUSCULAR | Status: AC
  Filled 2017-03-17: qty 500

## 2017-03-17 MED ORDER — FENTANYL CITRATE (PF) 100 MCG/2ML IJ SOLN
INTRAMUSCULAR | Status: DC | PRN
  Administered 2017-03-17 (×2): 25 ug via INTRAVENOUS
  Administered 2017-03-17 (×2): 12.5 ug via INTRAVENOUS
  Administered 2017-03-17 (×2): 25 ug via INTRAVENOUS

## 2017-03-17 MED ORDER — METHIMAZOLE 10 MG PO TABS
5.0000 mg | ORAL_TABLET | Freq: Every day | ORAL | Status: DC
Start: 2017-03-18 — End: 2017-03-18
  Administered 2017-03-18: 5 mg via ORAL
  Filled 2017-03-17: qty 1

## 2017-03-17 MED ORDER — ONDANSETRON HCL 4 MG/2ML IJ SOLN: 4.0000 mg | Freq: Four times a day (QID) | INTRAMUSCULAR | Status: DC | PRN

## 2017-03-17 MED ORDER — ACETAMINOPHEN 325 MG PO TABS: 325.0000 mg | ORAL_TABLET | ORAL | Status: DC | PRN

## 2017-03-17 MED ORDER — OXYCODONE HCL 5 MG PO TABS
5.0000 mg | ORAL_TABLET | Freq: Four times a day (QID) | ORAL | Status: DC | PRN
  Administered 2017-03-17 – 2017-03-18 (×3): 5 mg via ORAL
  Filled 2017-03-17 (×3): qty 1

## 2017-03-17 SURGICAL SUPPLY — 8 items
CABLE SURGICAL S-101-97-12 (CABLE) ×6 IMPLANT
IPG PACE AZUR XT DR MRI W1DR01 (Pacemaker) ×1 IMPLANT
LEAD CAPSURE NOVUS 45CM (Lead) ×3 IMPLANT
LEAD CAPSURE NOVUS 5076-52CM (Lead) ×3 IMPLANT
PACE AZURE XT DR MRI W1DR01 (Pacemaker) ×3 IMPLANT
PAD DEFIB LIFELINK (PAD) ×3 IMPLANT
SHEATH CLASSIC 7F (SHEATH) ×9 IMPLANT
TRAY PACEMAKER INSERTION (PACKS) ×3 IMPLANT

## 2017-03-17 NOTE — Progress Notes (Signed)
  Echocardiogram 2D Echocardiogram has been performed.  Anna PartridgeBrooke S Gutierrez [employer] 03/17/2017, 9:28 AM

## 2017-03-17 NOTE — Progress Notes (Signed)
PROGRESS NOTE   Anna SchwabMichelle C Hannis  WUJ:[id number]  DOB: 05/13/1951  DOA: 03/16/2017 PCP: Ralene OkMoreira, Roy, MD   Brief Admission Hx: Anna Gutierrez is a 66 y.o. female  with a history of hypertension, [facility], presenting to the ED with 2 day history of generalized weakness, lightheadedness with one episode of presyncope as she was to reach an object on the floor. She denies syncope. Denies any vision changes, headaches. No dysarthria or dysphagia. Denies any unilateral weakness or sensory deficiencies. Denies head trauma, confusion or seizures. Denies any chest pain, palpitations, or shortness of breath. Denies any fever or chills, or night sweats. Denies any abdominal pain. She has chronic incontinence of stool  Denies any sick contacts or new foods. Denies any recent long distance trips. No recent surgeries. Denies lower extremity swelling. Denies abnormal skin rashes, or neuropathy. Compliant with her medications. Last BP meds taken last night. No tobacco, alcohol or recreational drugs. She has family history of cardiac disease in her mother. She has never been seen by cardiology. Of note, her PCP had mentioned to her that she had "skipped heartbeats" during her last exam as OP  MDM/Assessment & Plan:   Symptomatic bradycardia  Pt on IV dobutamine infusion EP planning for pacemaker placement today.  Echocardiogram pending Troponin neg x 3.   Essential Hypertension Holding home meds  Hyperthyrodism - Pt s/p radiactive iodine ablation, TSH low and FT4 high, likely will need to start tapazole until she can follow up with her endocrinologist for further evaluation.    DVT prophylaxis: Lovenox  Code Status:   Full     Family Communication:  Discussed with patient Disposition Plan: Expect patient to be discharged to home after condition improves Consults called:    Cardiology as per EDP  Admission status:Tele   Obs     Consultants:  Cardiology  EP   Subjective: Pt now on dobutamine infusion.    Objective: Vitals:   03/17/17 0600 03/17/17 0700 03/17/17 0721 03/17/17 0800  BP: (!) 119/55 108/65  130/73  Pulse: 76 76  76  Resp: 19 11  12   Temp:   98.6 F (37 C)   TempSrc:   Oral   SpO2: 94% 98%  99%  Weight:      Height:        Intake/Output Summary (Last 24 hours) at 03/17/17 0845 Last data filed at 03/17/17 0800  Gross per 24 hour  Intake           557.47 ml  Output             1050 ml  Net          -492.53 ml   Filed Weights   03/16/17 1145 03/16/17 2200 03/17/17 0500  Weight: 73.6 kg (162 lb 4.8 oz) 74.7 kg (164 lb 10.9 oz) 74.7 kg (164 lb 10.9 oz)     REVIEW OF SYSTEMS  As per history otherwise all reviewed and reported negative  Exam:  General exam: awake, alert, NAD Respiratory system: Clear. No increased work of breathing. Cardiovascular system: S1 & S2 heard, No JVD, murmurs, gallops, clicks or pedal edema. Gastrointestinal system: Abdomen is nondistended, soft and nontender. Normal bowel sounds heard. Central nervous system: Alert and oriented. No focal neurological deficits. Extremities: no CCE.  Data Reviewed: Basic Metabolic Panel:  Recent Labs Lab 03/16/17 0937  NA 139  K 4.3  CL 107  CO2 23  GLUCOSE 106*  BUN 7  CREATININE 0.79  CALCIUM 9.6  MG 2.2   Liver Function Tests: No results for input(s): AST, ALT, ALKPHOS, BILITOT, PROT, ALBUMIN in the last 168 hours. No results for input(s): LIPASE, AMYLASE in the last 168 hours. No results for input(s): AMMONIA in the last 168 hours. CBC:  Recent Labs Lab 03/16/17 0937  WBC 9.3  NEUTROABS 6.6  HGB 14.0  HCT 42.6  MCV 86.2  PLT 302   Cardiac Enzymes:  Recent Labs Lab 03/16/17 1210 03/16/17 1725 03/16/17 2337  TROPONINI <0.03 <0.03 <0.03   CBG (last 3)   Recent Labs  03/17/17 0719  GLUCAP 96   Recent Results (from the past 240 hour(s))  MRSA PCR Screening     Status: None    Collection Time: 03/16/17 10:40 PM  Result Value Ref Range Status   MRSA by PCR NEGATIVE NEGATIVE Final    Comment:        The GeneXpert MRSA Assay (FDA approved for NASAL specimens only), is one component of a comprehensive MRSA colonization surveillance program. It is not intended to diagnose MRSA infection nor to guide or monitor treatment for MRSA infections.      Studies: X-ray Chest Pa And Lateral  Result Date: 03/16/2017 CLINICAL DATA:  Patient with lightheadedness.  Generalized weakness. EXAM: CHEST  2 VIEW COMPARISON:  None. FINDINGS: Monitoring leads overlie the patient. Normal cardiac and mediastinal contours. No consolidative pulmonary opacities. No pleural effusion or pneumothorax. Thoracic spine degenerative changes. IMPRESSION: No acute cardiopulmonary process. Electronically Signed   By: Annia Belt M.D.   On: 03/16/2017 11:22   Scheduled Meds: . chlorhexidine  60 mL Topical Once  . chlorhexidine  60 mL Topical Once  . gentamicin irrigation  80 mg Irrigation On Call  . sodium chloride flush  3 mL Intravenous Q12H   Continuous Infusions: . sodium chloride    .  ceFAZolin (ANCEF) IV    . DOBUTamine 2.536 mcg/kg/min (03/17/17 0800)    Active Problems:   Symptomatic bradycardia   Pre-syncope   Hypertension  Critical Care Time spent: 33 mins  Standley Dakins, MD, FAAFP [facility] Pager [phone]  If 7PM-7AM, please contact night-coverage [email or web address] Password [email or web address] 03/17/2017, 8:45 AM    LOS: 0 days

## 2017-03-17 NOTE — Progress Notes (Signed)
Orthopedic Tech Progress Note Patient Details:  Anna SchwabMichelle C Henslee [birth year] [id number] Patient has sling. Patient ID: Anna SchwabMichelle C Gutierrez, female   DOB: [birth year], 66 y.o.   MRN: [id number]   [name], [name] 03/17/2017, 1:10 PM

## 2017-03-17 NOTE — Consult Note (Signed)
ELECTROPHYSIOLOGY CONSULT NOTE    Patient ID: Anna Gutierrez MRN: [id number], DOB/AGE: 65-Apr-1952 65 y.o.  Admit date: 03/16/2017 Date of Consult: 03/17/2017  Primary Physician: Ralene OkMoreira, Roy, MD Primary Cardiologist: Tenny Crawoss  Reason for Consultation: symptomatic bradycardia  HPI:  Anna Gutierrez is a 66 y.o. female is referred by Dr Eden EmmsNishan for evaluation of symptomatic bradycardia.  Past medical history is significant for hypertension and hyperthyroidism s/p radioactive iodine treatment.  On 03/15/17, she woke up in her usual state of health.  As the morning progressed, she noticed that she was feeling weak and dizzy.  She was standing at her kitchen sink and felt like she was going to pass out.  She laid down on the couch and the feeling passed. She stayed in the bed most of the day on 7/3. On 7/4, she got up and was feeling "ok". She went outside to get the paper and had a syncopal spell. When she awoke, she continued to be weak and dizzy. She called her PCP who advised that she come to the hospital for evaluation. On arrival, she was found to be in sinus bradycardia with heart rates in the 30's.  Telemetry demonstrated persistent bradycardia and junctional rhythm with symptomatic pauses of up to 4 seconds. She was transferred to ICU last night and placed on Dobutamine. She is currently feeling improved with heart rates in the 60-70's.  She denies recent chest pain, shortness of breath, LE edema, fevers, chills, nausea or vomiting. She is on Lotrel at home, but no AVN blocking agents.   She does have an Interstim device implanted for fecal incontinence (manufactured by [employer]).   Past Medical History:  Diagnosis Date  . Hypertension   . Symptomatic bradycardia 03/16/2017  . Thyroid goiter    had radioactive iodine for treatment     Surgical History:  Past Surgical History:  Procedure Laterality Date  . CHOLECYSTECTOMY    . RECTAL PROLAPSE REPAIR  2016  . TONSILLECTOMY    . TUBAL  LIGATION       Prescriptions Prior to Admission  Medication Sig Dispense Refill Last Dose  . amLODipine-benazepril (LOTREL) 5-10 MG capsule Take 1 capsule by mouth daily.   03/15/2017 at Unknown time  . Cholecalciferol (VITAMIN D PO) Take 1 tablet by mouth daily.   03/16/2017 at Unknown time  . Loperamide HCl (IMODIUM A-D PO) Take 1 capsule by mouth daily as needed (loose stool).   PRN    Inpatient Medications:  . enoxaparin (LOVENOX) injection  40 mg Subcutaneous Q24H  . sodium chloride flush  3 mL Intravenous Q12H    Allergies: No Known Allergies  Social History   Social History  . Marital status: Married    Spouse name: N/A  . Number of children: N/A  . Years of education: N/A   Occupational History  . Not on file.   Social History Main Topics  . Smoking status: Never Smoker  . Smokeless tobacco: Never Used  . Alcohol use No  . Drug use: No  . Sexual activity: Not on file   Other Topics Concern  . Not on file   Social History Narrative  . No narrative on file     Family History: No premature CAD    Review of Systems: All other systems reviewed and are otherwise negative except as noted above.  Physical Exam: Vitals:   03/17/17 0500 03/17/17 0600 03/17/17 0700 03/17/17 0721  BP: (!) 103/46 (!) 119/55 108/65   Pulse: 74  76 76   Resp: (!) 21 19 11    Temp:    98.6 F (37 C)  TempSrc:    Oral  SpO2: 95% 94% 98%   Weight: 164 lb 10.9 oz (74.7 kg)     Height:        GEN- The patient is well appearing, alert and oriented x 3 today.   HEENT: normocephalic, atraumatic; sclera clear, conjunctiva pink; hearing intact; oropharynx clear; neck supple  Lungs- Clear to ausculation bilaterally, normal work of breathing.  No wheezes, rales, rhonchi Heart- Regular rate and rhythm, no murmurs, rubs or gallops  GI- soft, non-tender, non-distended, bowel sounds present  Extremities- no clubbing, cyanosis, or edema MS- no significant deformity or atrophy Skin- warm and dry,  no rash or lesion Psych- euthymic mood, full affect Neuro- strength and sensation are intact  Labs:   Lab Results  Component Value Date   WBC 9.3 03/16/2017   HGB 14.0 03/16/2017   HCT 42.6 03/16/2017   MCV 86.2 03/16/2017   PLT 302 03/16/2017     Recent Labs Lab 03/16/17 0937  NA 139  K 4.3  CL 107  CO2 23  BUN 7  CREATININE 0.79  CALCIUM 9.6  GLUCOSE 106*      Radiology/Studies: X-ray Chest Pa And Lateral Result Date: 03/16/2017 CLINICAL DATA:  Patient with lightheadedness.  Generalized weakness. EXAM: CHEST  2 VIEW COMPARISON:  None. FINDINGS: Monitoring leads overlie the patient. Normal cardiac and mediastinal contours. No consolidative pulmonary opacities. No pleural effusion or pneumothorax. Thoracic spine degenerative changes. IMPRESSION: No acute cardiopulmonary process. Electronically Signed   By: Annia Belt M.D.   On: 03/16/2017 11:22    WUJ:WJXBJ brady, rate 35, PACs (personally reviewed)  TELEMETRY: sinus rhythm since being on Dobutamine (personally reviewed)   Assessment/Plan: 1.  Symptomatic sinus bradycardia The patient presents with symptomatic sinus bradycardia. She has no reversible causes identified and meets criteria for pacemaker implantation.  I have asked for echo to be done ASAP this morning. Risks, benefits to pacemaker implant reviewed with the patient who wishes to proceed. She has a [employer] Interstim device implanted for fecal incontinence. Jemari Hallum plan for [employer] pacemaker. I have discussed with rep who states there are no concerns for interference with device.   2.  HTN Stable No change required today   Signed, Gypsy Balsam, NP 03/17/2017 7:58 AM   I have seen and examined this patient with Gypsy Balsam.  Agree with above, note added to reflect my findings.  On exam, RRR, no murmurs, lungs clear. Patient with symptomatic bradycardia and possible syncope. Plan for pacemaker implant. Risks and benefits discussed. Risks include but not limited to  bleeding, infection, tamponade, pneumothorax. She understands the risks and has agreed to the procedure. Daemyn Gariepy get a TTE prior to the pacemake implant to better determine LVEF.    Emmabelle Fear M. Cleveland Yarbro MD 03/17/2017 8:26 AM

## 2017-03-17 NOTE — Progress Notes (Signed)
PROGRESS NOTE    Anna Gutierrez  RUE:[id number]  DOB: 01-Mar-1951  DOA: 03/16/2017 PCP: Ralene Ok, MD   Brief Admission Hx:  Anna Gutierrez is a 66 y.o. female  with a history of hypertension, rectal prolapse followed at [facility], presenting to the ED with 2 day history of generalized weakness, lightheadedness with one episode of presyncope as she was to reach an object on the floor. She denies syncope. Denies any vision changes, headaches. No dysarthria or dysphagia. Denies any unilateral weakness or sensory deficiencies. Denies head trauma, confusion or seizures. Denies any chest pain, palpitations, or shortness of breath. Denies any fever or chills, or night sweats. Denies any abdominal pain. She has chronic incontinence of stool  Denies any sick contacts or new foods. Denies any recent long distance trips. No recent surgeries. Denies lower extremity swelling. Denies abnormal skin rashes, or neuropathy. Compliant with her medications. Last BP meds taken last night. No tobacco, alcohol or recreational drugs. She has family history of cardiac disease in her mother. She has never been seen by cardiology. Of note, her PCP had mentioned to her that she had "skipped heartbeats" during her last exam as OP   MDM/Assessment & Plan:    Symptomatic bradycardia  Pt on IV dobutamine infusion EP planning for pacemaker placement today.  Echocardiogram pending   Hypertension BP 97/65   Pulse 51  Lotrel's last dose last evening . Currently borderline hypotensive  Continue home anti-hypertensive medications tonight unless BP continues to be low  IVF 100 cc/h   Hyperthyrodism - Pt s/p radiactive ablation, now with low TSH and elevated FT4 will likely need tapazole until she can follow up with endocrinologist.    DVT prophylaxis: Lovenox  Code Status:   Full     Family Communication:  Discussed with patient Disposition Plan: Expect patient to be discharged to home after condition  improves Consults called:    Cardiology as per EDP  Admission status:Tele   Obs     Consultants:  Cardiology  EP   Subjective: Pt now on dobutamine infusion.    Objective: Vitals:   03/17/17 0600 03/17/17 0700 03/17/17 0721 03/17/17 0800  BP: (!) 119/55 108/65  130/73  Pulse: 76 76  76  Resp: 19 11  12   Temp:   98.6 F (37 C)   TempSrc:   Oral   SpO2: 94% 98%  99%  Weight:      Height:        Intake/Output Summary (Last 24 hours) at 03/17/17 0845 Last data filed at 03/17/17 0800  Gross per 24 hour  Intake           557.47 ml  Output             1050 ml  Net          -492.53 ml   Filed Weights   03/16/17 1145 03/16/17 2200 03/17/17 0500  Weight: 73.6 kg (162 lb 4.8 oz) 74.7 kg (164 lb 10.9 oz) 74.7 kg (164 lb 10.9 oz)     REVIEW OF SYSTEMS  As per history otherwise all reviewed and reported negative  Exam:  General exam: awake, alert, NAD Respiratory system: Clear. No increased work of breathing. Cardiovascular system: S1 & S2 heard, No JVD, murmurs, gallops, clicks or pedal edema. Gastrointestinal system: Abdomen is nondistended, soft and nontender. Normal bowel sounds heard. Central nervous system: Alert and oriented. No focal neurological deficits. Extremities: no CCE.  Data Reviewed: Basic Metabolic Panel:  Recent  Labs Lab 03/16/17 0937  NA 139  K 4.3  CL 107  CO2 23  GLUCOSE 106*  BUN 7  CREATININE 0.79  CALCIUM 9.6  MG 2.2   Liver Function Tests: No results for input(s): AST, ALT, ALKPHOS, BILITOT, PROT, ALBUMIN in the last 168 hours. No results for input(s): LIPASE, AMYLASE in the last 168 hours. No results for input(s): AMMONIA in the last 168 hours. CBC:  Recent Labs Lab 03/16/17 0937  WBC 9.3  NEUTROABS 6.6  HGB 14.0  HCT 42.6  MCV 86.2  PLT 302   Cardiac Enzymes:  Recent Labs Lab 03/16/17 1210 03/16/17 1725 03/16/17 2337  TROPONINI <0.03 <0.03 <0.03   CBG (last 3)   Recent Labs  03/17/17 0719  GLUCAP 96    Recent Results (from the past 240 hour(s))  MRSA PCR Screening     Status: None   Collection Time: 03/16/17 10:40 PM  Result Value Ref Range Status   MRSA by PCR NEGATIVE NEGATIVE Final    Comment:        The GeneXpert MRSA Assay (FDA approved for NASAL specimens only), is one component of a comprehensive MRSA colonization surveillance program. It is not intended to diagnose MRSA infection nor to guide or monitor treatment for MRSA infections.      Studies: X-ray Chest Pa And Lateral  Result Date: 03/16/2017 CLINICAL DATA:  Patient with lightheadedness.  Generalized weakness. EXAM: CHEST  2 VIEW COMPARISON:  None. FINDINGS: Monitoring leads overlie the patient. Normal cardiac and mediastinal contours. No consolidative pulmonary opacities. No pleural effusion or pneumothorax. Thoracic spine degenerative changes. IMPRESSION: No acute cardiopulmonary process. Electronically Signed   By: Annia Beltrew  Davis M.D.   On: 03/16/2017 11:22     Scheduled Meds: . chlorhexidine  60 mL Topical Once  . chlorhexidine  60 mL Topical Once  . gentamicin irrigation  80 mg Irrigation On Call  . sodium chloride flush  3 mL Intravenous Q12H   Continuous Infusions: . sodium chloride    .  ceFAZolin (ANCEF) IV    . DOBUTamine 2.536 mcg/kg/min (03/17/17 0800)    Active Problems:   Symptomatic bradycardia   Pre-syncope   Hypertension  Critical Care Time spent: 33 mins  Standley Dakinslanford Johnson, MD, FAAFP [facility] Pager [phone]  If 7PM-7AM, please contact night-coverage [email or web address] Password [email or web address] 03/17/2017, 8:45 AM    LOS: 0 days

## 2017-03-17 NOTE — Progress Notes (Signed)
Pt's diet is advanced to Heart healthy after she is able to tolerate clear liquid diet, getting up and using bed side commode, sling is present in left hand this time, EKG and CXR scheduled for tomorrow am, will  Continue to monitor the patient

## 2017-03-17 NOTE — Progress Notes (Signed)
Pt's received from cath lab after the pacemaker insertion procedure, pt is drowsy but responding well this moment, pain medicine provided for pain in insertion site, vitals stable, incision site CDI, will continue to monitor

## 2017-03-17 NOTE — Progress Notes (Signed)
Patients wallet and cash placed in sealed envelope and sent to security and placed in safe.  Patients clothing and purse/ cellphone remain in room and will be transferred to new room assignment once pacemaker placement is completed.

## 2017-03-18 ENCOUNTER — Inpatient Hospital Stay (HOSPITAL_COMMUNITY): Payer: BC Managed Care – PPO

## 2017-03-18 LAB — GLUCOSE, CAPILLARY: Glucose-Capillary: 97 mg/dL (ref 65–99)

## 2017-03-18 MED ORDER — ACETAMINOPHEN 325 MG PO TABS: 325.0000 mg | ORAL_TABLET | ORAL | Status: AC | PRN

## 2017-03-18 MED ORDER — METHIMAZOLE 5 MG PO TABS: 5.0000 mg | ORAL_TABLET | Freq: Every day | ORAL | 0 refills | Status: DC

## 2017-03-18 NOTE — Discharge Instructions (Signed)
Supplemental Discharge Instructions for  Pacemaker/Defibrillator Patients  Activity No heavy lifting or vigorous activity with your left/right arm for 6 to 8 weeks.  Do not raise your left/right arm above your head for one week.  Gradually raise your affected arm as drawn below.           __       03/21/17                  03/22/17                      03/23/17                      03/24/17  NO DRIVING for  1 week   ; you may begin driving on  4/09/817/12/18   .  WOUND CARE - Keep the wound area clean and dry.  Do not get this area wet for one week. No showers for one week; you may shower on  03/24/17   . - The tape/steri-strips on your wound will fall off; do not pull them off.  No bandage is needed on the site.  DO  NOT apply any creams, oils, or ointments to the wound area. - If you notice any drainage or discharge from the wound, any swelling or bruising at the site, or you develop a fever > 101? F after you are discharged home, call the office at once.  Special Instructions - You are still able to use cellular telephones; use the ear opposite the side where you have your pacemaker/defibrillator.  Avoid carrying your cellular phone near your device. - When traveling through airports, show security personnel your identification card to avoid being screened in the metal detectors.  Ask the security personnel to use the hand wand. - Avoid arc welding equipment, MRI testing (magnetic resonance imaging), TENS units (transcutaneous nerve stimulators).  Call the office for questions about other devices. - Avoid electrical appliances that are in poor condition or are not properly grounded. - Microwave ovens are safe to be near or to operate.    Follow with Primary MD  Ralene OkMoreira, Roy, MD  and other consultant's as instructed your Hospitalist MD  Please get a complete blood count and chemistry panel checked by your Primary MD at your next visit, and again as instructed by your Primary MD.  Get  Medicines reviewed and adjusted: Please take all your medications with you for your next visit with your Primary MD  Laboratory/radiological data: Please request your Primary MD to go over all hospital tests and procedure/radiological results at the follow up, please ask your Primary MD to get all Hospital records sent to his/her office.  In some cases, they will be blood work, cultures and biopsy results pending at the time of your discharge. Please request that your primary care M.D. follows up on these results.  Also Note the following: If you experience worsening of your admission symptoms, develop shortness of breath, life threatening emergency, suicidal or homicidal thoughts you must seek medical attention immediately by calling 911 or calling your MD immediately  if symptoms less severe.  You must read complete instructions/literature along with all the possible adverse reactions/side effects for all the Medicines you take and that have been prescribed to you. Take any new Medicines after you have completely understood and accpet all the possible adverse reactions/side effects.   Do not drive when taking Pain medications or sleeping medications (  Benzodaizepines)  Do not take more than prescribed Pain, Sleep and Anxiety Medications. It is not advisable to combine anxiety,sleep and pain medications without talking with your primary care practitioner  Special Instructions: If you have smoked or chewed Tobacco  in the last 2 yrs please stop smoking, stop any regular Alcohol  and or any Recreational drug use.  Wear Seat belts while driving.  Please note: You were cared for by a hospitalist during your hospital stay. Once you are discharged, your primary care physician will handle any further medical issues. Please note that NO REFILLS for any discharge medications will be authorized once you are discharged, as it is imperative that you return to your primary care physician (or establish a  relationship with a primary care physician if you do not have one) for your post hospital discharge needs so that they can reassess your need for medications and monitor your lab values.

## 2017-03-18 NOTE — Progress Notes (Signed)
Patient alert and responsible. Able to verbalizes her needs. Oxycodone 5 mg given for left chest pain. Denies SOB. No apparent distress noted. No active bleeding around the new pacemaker site. Will continue to monitor.

## 2017-03-18 NOTE — Progress Notes (Signed)
Progress Note  Patient Name: Fermin SchwabMichelle C Lame Date of Encounter: 03/18/2017  Primary Cardiologist: Elberta Fortisamnitz  Subjective   Feeling well without CP or SOB  Inpatient Medications    Scheduled Meds: . methimazole  5 mg Oral Daily  . sodium chloride flush  3 mL Intravenous Q12H   Continuous Infusions: . DOBUTamine Stopped (03/17/17 1156)   PRN Meds: acetaminophen, ondansetron (ZOFRAN) IV, ondansetron **OR** [DISCONTINUED] ondansetron (ZOFRAN) IV, oxyCODONE   Vital Signs    Vitals:   03/17/17 1700 03/17/17 2110 03/18/17 0036 03/18/17 0337  BP: 126/60 134/66 (!) 121/58 115/73  Pulse: 69 76 67 67  Resp:  20 18 18   Temp:  98.7 F (37.1 C) 98.5 F (36.9 C) 98.3 F (36.8 C)  TempSrc:  Oral Oral Oral  SpO2:  97% 95% 97%  Weight:    158 lb 6.4 oz (71.8 kg)  Height:        Intake/Output Summary (Last 24 hours) at 03/18/17 0827 Last data filed at 03/18/17 0338  Gross per 24 hour  Intake           747.19 ml  Output             1351 ml  Net          -603.81 ml   Filed Weights   03/16/17 2200 03/17/17 0500 03/18/17 0337  Weight: 164 lb 10.9 oz (74.7 kg) 164 lb 10.9 oz (74.7 kg) 158 lb 6.4 oz (71.8 kg)    Telemetry    Sinus rhythm, intermittent A pacing - Personally Reviewed  ECG    Sinus rhythm, rate 67 - Personally Reviewed  Physical Exam   GEN: No acute distress.   Neck: No JVD Cardiac: RRR, no murmurs, rubs, or gallops.  Respiratory: Clear to auscultation bilaterally. GI: Soft, nontender, non-distended  MS: No edema; No deformity. Neuro:  Nonfocal  Psych: Normal affect   Labs    Chemistry Recent Labs Lab 03/16/17 0937  NA 139  K 4.3  CL 107  CO2 23  GLUCOSE 106*  BUN 7  CREATININE 0.79  CALCIUM 9.6  GFRNONAA >60  GFRAA >60  ANIONGAP 9     Hematology Recent Labs Lab 03/16/17 0937  WBC 9.3  RBC 4.94  HGB 14.0  HCT 42.6  MCV 86.2  MCH 28.3  MCHC 32.9  RDW 13.8  PLT 302    Cardiac Enzymes Recent Labs Lab 03/16/17 1210  03/16/17 1725 03/16/17 2337  TROPONINI <0.03 <0.03 <0.03    Recent Labs Lab 03/16/17 0947  TROPIPOC 0.00     BNPNo results for input(s): BNP, PROBNP in the last 168 hours.   DDimer No results for input(s): DDIMER in the last 168 hours.   Radiology    Dg Chest 2 View  Result Date: 03/18/2017 CLINICAL DATA:  Status post pacemaker insertion EXAM: CHEST  2 VIEW COMPARISON:  03/16/2017 FINDINGS: Cardiac shadow is mildly enlarged but stable. A dual lead pacemaker is now seen. No pneumothorax is noted. No focal infiltrate is seen. No bony abnormality is noted. IMPRESSION: Status post pacemaker placement.  No pneumothorax is noted. Electronically Signed   By: Alcide CleverMark  Lukens M.D.   On: 03/18/2017 08:01   X-ray Chest Pa And Lateral  Result Date: 03/16/2017 CLINICAL DATA:  Patient with lightheadedness.  Generalized weakness. EXAM: CHEST  2 VIEW COMPARISON:  None. FINDINGS: Monitoring leads overlie the patient. Normal cardiac and mediastinal contours. No consolidative pulmonary opacities. No pleural effusion or pneumothorax. Thoracic spine degenerative changes.  IMPRESSION: No acute cardiopulmonary process. Electronically Signed   By: Annia Belt M.D.   On: 03/16/2017 11:22    Cardiac Studies   TTE - Left ventricle: The cavity size was normal. Wall thickness was   increased in a pattern of moderate LVH. Systolic function was   normal. The estimated ejection fraction was in the range of 60%   to 65%. Wall motion was normal; there were no regional wall   motion abnormalities. Doppler parameters are consistent with   abnormal left ventricular relaxation (grade 1 diastolic   dysfunction). - Left atrium: The atrium was mildly dilated. - Pulmonary arteries: Systolic pressure was moderately increased.   PA peak pressure: 57 mm Hg (S).  Patient Profile     66 y.o. female presented to the hospital with near syncope, found to have sinus node dysfunction  Assessment & Plan    Sinus node  dysfunction: had near syncope previously. Had pacemaker implanted yesterday. Interrogation and CXR without abnormality. Plan for follow up in device clinic in 10 days.   Hypertension: BP stable, no changes at this time  Signed, Forrest Jaroszewski Jorja Loa, MD  03/18/2017, 8:27 AM

## 2017-03-18 NOTE — Discharge Summary (Signed)
Physician Discharge Summary  Anna Gutierrez ZOX:[id number] DOB: 04/17/1951 DOA: 03/16/2017  PCP: Ralene Ok, MD EP cardiologist: Elberta Fortis  Admit date: 03/16/2017 Discharge date: 03/18/2017  Admitted From: Home  Disposition: Home   Recommendations for Outpatient Follow-up:  1. Follow up with PCP in 1 weeks 2. Follow up with cardiologist in 10 days 3. No driving for 1 week.  4. Please refer to endocrinologist for hyperthyroidism next 2-3 weeks 5. Please obtain BMP/CBC in one week  Discharge Condition: Stable     CODE STATUS: Full    Brief Hospitalization Summary: Please see all hospital notes, images, labs for full details of the hospitalization. HPI: Anna Gutierrez is a 66 y.o. female  with a history of hypertension, rectal prolapse followed at [facility], presenting to the ED with 2 day history of generalized weakness, lightheadedness with one episode of presyncope as she was to reach an object on the floor. She denies syncope. Denies any vision changes, headaches. No dysarthria or dysphagia. Denies any unilateral weakness or sensory deficiencies. Denies head trauma, confusion or seizures. Denies any chest pain, palpitations, or shortness of breath. Denies any fever or chills, or night sweats. Denies any abdominal pain. She has chronic incontinence of stool  Denies any sick contacts or new foods. Denies any recent long distance trips. No recent surgeries. Denies lower extremity swelling. Denies abnormal skin rashes, or neuropathy. Compliant with her medications. Last BP meds taken last night. No tobacco, alcohol or recreational drugs. She has family history of cardiac disease in her mother. She has never been seen by cardiology. Of note, her PCP had mentioned to her that she had "skipped heartbeats" during her last exam as OP  ED Course:  BP 97/65   Pulse (!) 51   Temp 98.2 F (36.8 C) (Oral)   Resp 17   Ht 5\' 5"  (1.651 m)   Wt 72.1 kg (159 lb)   SpO2 96%   BMI 26.46 kg/m    CBC and  CMET normal  CO2 23 received 1 L 05 fluids at the ER EKG SBrady with atrial premature complexes Trop 0  MDM/Assessment & Plan:    Symptomatic bradycardia  Pt was on IV dobutamine infusion but now discontinued EP consulted for pacemaker placement done on 03/17/17  Echocardiogram EF 60-65% with grade 1 DD.  Pt tolerated procedure well, will discharge with close outpatient follow up.  No driving for 1 week.    Hypertension BP 97/65  Pulse 51 Holding lotrel for now due to soft  BPs.  Reassess on outpatient follow up.   Hyperthyrodism - Pt s/p radiactive ablation, now with low TSH and elevated FT4, started low dose tapazole 5 mg daily until she can follow up with endocrinologist outpatient.  Would like for her to be seen in next 2 weeks.   DVT prophylaxis:Lovenox  Code Status:Full  Family Communication:Discussed with patient Disposition Plan:Expect patient to be discharged to home after condition improves Consults called:Cardiology as per EDP  Admission status:Tele Obs    Consultants:  Cardiology  EP   Discharge Diagnoses:  Active Problems:   Symptomatic bradycardia   Pre-syncope   Hypertension  Discharge Instructions: Discharge Instructions    Ambulatory referral to Endocrinology    Complete by:  As directed    Increase activity slowly    Complete by:  As directed      Allergies as of 03/18/2017   No Known Allergies     Medication List    STOP taking these medications  amLODipine-benazepril 5-10 MG capsule Commonly known as:  LOTREL     TAKE these medications   acetaminophen 325 MG tablet Commonly known as:  TYLENOL Take 1-2 tablets (325-650 mg total) by mouth every 4 (four) hours as needed for mild pain.   IMODIUM A-D PO Take 1 capsule by mouth daily as needed (loose stool).   methimazole 5 MG tablet Commonly known as:  TAPAZOLE Take 1 tablet (5 mg total) by mouth daily. Start taking on:  03/19/2017   VITAMIN D PO Take 1  tablet by mouth daily.      Follow-up Information    [facility] Follow up on 03/30/2017.   Specialty:  Cardiology Why:  at 3:30PM  Contact information: [address], [address] [place] [address] [phone]       Regan Lemming, MD Follow up on 06/20/2017.   Specialty:  Cardiology Why:  at 10:45AM Contact information: [address] [place] [address] [phone]        Ralene Ok, MD. Schedule an appointment as soon as possible for a visit in 1 week(s).   Specialty:  Internal Medicine Why:  hospital Follow Up and get referral to endocrinologist Contact information: [address] [facility] [address] [phone]          No Known Allergies Current Discharge Medication List    START taking these medications   Details  acetaminophen (TYLENOL) 325 MG tablet Take 1-2 tablets (325-650 mg total) by mouth every 4 (four) hours as needed for mild pain.    methimazole (TAPAZOLE) 5 MG tablet Take 1 tablet (5 mg total) by mouth daily. Qty: 30 tablet, Refills: 0      CONTINUE these medications which have NOT CHANGED   Details  Cholecalciferol (VITAMIN D PO) Take 1 tablet by mouth daily.    Loperamide HCl (IMODIUM A-D PO) Take 1 capsule by mouth daily as needed (loose stool).      STOP taking these medications     amLODipine-benazepril (LOTREL) 5-10 MG capsule        Procedures/Studies: Dg Chest 2 View  Result Date: 03/18/2017 CLINICAL DATA:  Status post pacemaker insertion EXAM: CHEST  2 VIEW COMPARISON:  03/16/2017 FINDINGS: Cardiac shadow is mildly enlarged but stable. A dual lead pacemaker is now seen. No pneumothorax is noted. No focal infiltrate is seen. No bony abnormality is noted. IMPRESSION: Status post pacemaker placement.  No pneumothorax is noted. Electronically Signed   By: Alcide Clever M.D.   On: 03/18/2017 08:01   X-ray Chest Pa And Lateral  Result Date: 03/16/2017 CLINICAL DATA:   Patient with lightheadedness.  Generalized weakness. EXAM: CHEST  2 VIEW COMPARISON:  None. FINDINGS: Monitoring leads overlie the patient. Normal cardiac and mediastinal contours. No consolidative pulmonary opacities. No pleural effusion or pneumothorax. Thoracic spine degenerative changes. IMPRESSION: No acute cardiopulmonary process. Electronically Signed   By: Annia Belt M.D.   On: 03/16/2017 11:22      Subjective: Pt awake alert, No complaints. Tenderness at incision site improving.    Discharge Exam: Vitals:   03/18/17 0036 03/18/17 0337  BP: (!) 121/58 115/73  Pulse: 67 67  Resp: 18 18  Temp: 98.5 F (36.9 C) 98.3 F (36.8 C)   Vitals:   03/17/17 1700 03/17/17 2110 03/18/17 0036 03/18/17 0337  BP: 126/60 134/66 (!) 121/58 115/73  Pulse: 69 76 67 67  Resp:  20 18 18   Temp:  98.7 F (37.1 C)  98.5 F (36.9 C) 98.3 F (36.8 C)  TempSrc:  Oral Oral Oral  SpO2:  97% 95% 97%  Weight:    71.8 kg (158 lb 6.4 oz)  Height:       General: Pt is alert, awake, not in acute distress Cardiovascular: RRR, S1/S2 +, no rubs, no gallops Respiratory: CTA bilaterally, no wheezing, no rhonchi Abdominal: Soft, NT, ND, bowel sounds + Extremities: no edema, no cyanosis   The results of significant diagnostics from this hospitalization (including imaging, microbiology, ancillary and laboratory) are listed below for reference.    Microbiology: Recent Results (from the past 240 hour(s))  MRSA PCR Screening     Status: None   Collection Time: 03/16/17 10:40 PM  Result Value Ref Range Status   MRSA by PCR NEGATIVE NEGATIVE Final    Comment:        The GeneXpert MRSA Assay (FDA approved for NASAL specimens only), is one component of a comprehensive MRSA colonization surveillance program. It is not intended to diagnose MRSA infection nor to guide or monitor treatment for MRSA infections.     Labs: BNP (last 3 results) No results for input(s): BNP in the last 8760 hours. Basic  Metabolic Panel:  Recent Labs Lab 03/16/17 0937  NA 139  K 4.3  CL 107  CO2 23  GLUCOSE 106*  BUN 7  CREATININE 0.79  CALCIUM 9.6  MG 2.2   Liver Function Tests: No results for input(s): AST, ALT, ALKPHOS, BILITOT, PROT, ALBUMIN in the last 168 hours. No results for input(s): LIPASE, AMYLASE in the last 168 hours. No results for input(s): AMMONIA in the last 168 hours. CBC:  Recent Labs Lab 03/16/17 0937  WBC 9.3  NEUTROABS 6.6  HGB 14.0  HCT 42.6  MCV 86.2  PLT 302   Cardiac Enzymes:  Recent Labs Lab 03/16/17 1210 03/16/17 1725 03/16/17 2337  TROPONINI <0.03 <0.03 <0.03   BNP: Invalid input(s): POCBNP CBG:  Recent Labs Lab 03/17/17 0719 03/18/17 0655  GLUCAP 96 97   D-Dimer No results for input(s): DDIMER in the last 72 hours. Hgb A1c No results for input(s): HGBA1C in the last 72 hours. Lipid Profile No results for input(s): CHOL, HDL, LDLCALC, TRIG, CHOLHDL, LDLDIRECT in the last 72 hours. Thyroid function studies  Recent Labs  03/16/17 1210  TSH <0.010*   Anemia work up No results for input(s): VITAMINB12, FOLATE, FERRITIN, TIBC, IRON, RETICCTPCT in the last 72 hours. Urinalysis    Component Value Date/Time   COLORURINE YELLOW 03/16/2017 1122   APPEARANCEUR CLEAR 03/16/2017 1122   LABSPEC 1.008 03/16/2017 1122   PHURINE 6.0 03/16/2017 1122   GLUCOSEU NEGATIVE 03/16/2017 1122   HGBUR NEGATIVE 03/16/2017 1122   BILIRUBINUR NEGATIVE 03/16/2017 1122   KETONESUR NEGATIVE 03/16/2017 1122   PROTEINUR NEGATIVE 03/16/2017 1122   NITRITE NEGATIVE 03/16/2017 1122   LEUKOCYTESUR NEGATIVE 03/16/2017 1122   Sepsis Labs Invalid input(s): PROCALCITONIN,  WBC,  LACTICIDVEN Microbiology Recent Results (from the past 240 hour(s))  MRSA PCR Screening     Status: None   Collection Time: 03/16/17 10:40 PM  Result Value Ref Range Status   MRSA by PCR NEGATIVE NEGATIVE Final    Comment:        The GeneXpert MRSA Assay (FDA approved for NASAL  specimens only), is one component of a comprehensive MRSA colonization surveillance program. It is not intended to diagnose MRSA infection nor to guide or monitor treatment for MRSA infections.    Time coordinating discharge: 31 mins  SIGNED:  Standley Dakins, MD  [facility] 03/18/2017, 11:15 AM Pager [phone]  If 7PM-7AM, please contact night-coverage [email or web address] Password [email or web address]

## 2017-03-18 NOTE — Progress Notes (Signed)
Pt discharge education provided at bedside. Pt has all belongings including wallet from safe and printed prescriptions. Pt IV discontinued catheter intact and telemetry removed. Pt discharged via wheelchair with nurse tech   Antwanette Wesche

## 2017-03-30 ENCOUNTER — Ambulatory Visit (INDEPENDENT_AMBULATORY_CARE_PROVIDER_SITE_OTHER): Payer: BC Managed Care – PPO | Admitting: *Deleted

## 2017-03-30 ENCOUNTER — Telehealth: Payer: Self-pay

## 2017-03-30 DIAGNOSIS — Z950 Presence of cardiac pacemaker: Secondary | ICD-10-CM

## 2017-03-30 DIAGNOSIS — R001 Bradycardia, unspecified: Secondary | ICD-10-CM | POA: Diagnosis not present

## 2017-03-30 LAB — CUP PACEART INCLINIC DEVICE CHECK
Brady Statistic AP VS Percent: 11.87 %
Brady Statistic AS VP Percent: 0.03 %
Brady Statistic RV Percent Paced: 0.05 %
Implantable Lead Implant Date: 20180705
Implantable Lead Location: 753859
Implantable Lead Location: 753860
Implantable Lead Model: 5076
Lead Channel Impedance Value: 380 Ohm
Lead Channel Impedance Value: 475 Ohm
Lead Channel Impedance Value: 513 Ohm
Lead Channel Pacing Threshold Pulse Width: 0.4 ms
Lead Channel Pacing Threshold Pulse Width: 0.4 ms
Lead Channel Sensing Intrinsic Amplitude: 9.625 mV
Lead Channel Setting Pacing Amplitude: 3.5 V
Lead Channel Setting Pacing Amplitude: 3.5 V
Lead Channel Setting Sensing Sensitivity: 2.8 mV
MDC IDC LEAD IMPLANT DT: 20180705
MDC IDC MSMT BATTERY REMAINING LONGEVITY: 179 mo
MDC IDC MSMT BATTERY VOLTAGE: 3.22 V
MDC IDC MSMT LEADCHNL RA IMPEDANCE VALUE: 361 Ohm
MDC IDC MSMT LEADCHNL RA PACING THRESHOLD AMPLITUDE: 1 V
MDC IDC MSMT LEADCHNL RA SENSING INTR AMPL: 2.25 mV
MDC IDC MSMT LEADCHNL RV PACING THRESHOLD AMPLITUDE: 0.75 V
MDC IDC PG IMPLANT DT: 20180705
MDC IDC SESS DTM: 20180718161626
MDC IDC SET LEADCHNL RV PACING PULSEWIDTH: 0.4 ms
MDC IDC STAT BRADY AP VP PERCENT: 0.02 %
MDC IDC STAT BRADY AS VS PERCENT: 88.08 %
MDC IDC STAT BRADY RA PERCENT PACED: 11.89 %

## 2017-03-30 NOTE — Telephone Encounter (Signed)
Call to patient to inquire if she would be interested in monitoring her device with her smart phone. Very pleasant, but does not have the required IPhone or IPad.

## 2017-03-30 NOTE — Progress Notes (Signed)
Wound check appointment. Steri-strips removed. Wound without redness or edema. Incision edges approximated, wound well healed. Normal device function. Thresholds, sensing, and impedances consistent with implant measurements. Device programmed at 3.5V with auto capture programmed on for extra safety margin until 3 month visit. Histogram distribution appropriate for patient and level of activity. No mode switches or high ventricular rates noted. Patient educated about wound care, arm mobility, lifting restrictions. ROV with WC on 06/20/17.

## 2017-04-04 ENCOUNTER — Emergency Department (HOSPITAL_COMMUNITY): Payer: BC Managed Care – PPO

## 2017-04-04 ENCOUNTER — Telehealth: Payer: Self-pay | Admitting: Cardiology

## 2017-04-04 ENCOUNTER — Emergency Department (HOSPITAL_COMMUNITY)
Admission: EM | Admit: 2017-04-04 | Discharge: 2017-04-05 | Disposition: A | Discharge status: Home / Self Care | Payer: BC Managed Care – PPO | Attending: Emergency Medicine | Admitting: Emergency Medicine

## 2017-04-04 ENCOUNTER — Encounter (HOSPITAL_COMMUNITY): Payer: Self-pay | Admitting: Emergency Medicine

## 2017-04-04 DIAGNOSIS — Z79899 Other long term (current) drug therapy: Secondary | ICD-10-CM | POA: Diagnosis not present

## 2017-04-04 DIAGNOSIS — I10 Essential (primary) hypertension: Secondary | ICD-10-CM | POA: Diagnosis not present

## 2017-04-04 DIAGNOSIS — R52 Pain, unspecified: Secondary | ICD-10-CM

## 2017-04-04 DIAGNOSIS — Z950 Presence of cardiac pacemaker: Secondary | ICD-10-CM | POA: Diagnosis not present

## 2017-04-04 DIAGNOSIS — G8918 Other acute postprocedural pain: Secondary | ICD-10-CM | POA: Diagnosis present

## 2017-04-04 NOTE — ED Provider Notes (Addendum)
TIME SEEN: 11:52 PM  CHIEF COMPLAINT: Pain over the pacemaker site  HPI: Patient is a 66 year old female with history of hypertension, symptomatic bradycardia status post pacemaker that was placed on 03/17/17 who presents emergency department with discomfort over her pacemaker site that started tonight. She describes as feeling "uncomfortable", "tight", "swollen". States is tender to palpation. She felt like this area was warm but not red. She had a oral temperature at home of 99.4. She did not take any antipyretics. Took tramadol prior to arrival and reports feeling better. She does not think that this is cardiac chest pain. She denies any tightness throughout her chest just over her pacemaker site. No shortness of breath, nausea, vomiting, dizziness, diaphoresis. No recent cough, diarrhea, rash, sore throat, dysuria, hematuria. Otherwise has been feeling well. States she called the on-call cardiologist who instructed her to come to the emergency department.  ROS: See HPI Constitutional: no fever  Eyes: no drainage  ENT: no runny nose   Cardiovascular: Discomfort over her pacemaker site Resp: no SOB  GI: no vomiting GU: no dysuria Integumentary: no rash  Allergy: no hives  Musculoskeletal: no leg swelling  Neurological: no slurred speech ROS otherwise negative  PAST MEDICAL HISTORY/PAST SURGICAL HISTORY:  Past Medical History:  Diagnosis Date  . Hypertension   . Symptomatic bradycardia 03/16/2017  . Thyroid goiter    had radioactive iodine for treatment    MEDICATIONS:  Prior to Admission medications   Medication Sig Start Date End Date Taking? Authorizing Provider  acetaminophen (TYLENOL) 325 MG tablet Take 1-2 tablets (325-650 mg total) by mouth every 4 (four) hours as needed for mild pain. 03/18/17   Johnson, Clanford L, MD  Cholecalciferol (VITAMIN D PO) Take 1 tablet by mouth daily.    [provider]  Loperamide HCl (IMODIUM A-D PO) Take 1 capsule by mouth daily as  needed (loose stool).    [provider]  methimazole (TAPAZOLE) 5 MG tablet Take 1 tablet (5 mg total) by mouth daily. 03/19/17   Cleora FleetJohnson, Clanford L, MD    ALLERGIES:  No Known Allergies  SOCIAL HISTORY:  Social History  Substance Use Topics  . Smoking status: Never Smoker  . Smokeless tobacco: Never Used  . Alcohol use No    FAMILY HISTORY: History reviewed. No pertinent family history.  EXAM: BP (!) 146/74   Pulse 80   Temp 98.9 F (37.2 C) (Oral)   Resp 16   SpO2 99%  CONSTITUTIONAL: Alert and oriented and responds appropriately to questions. Well-appearing; well-nourished HEAD: Normocephalic EYES: Conjunctivae clear, pupils appear equal, EOMI ENT: normal nose; moist mucous membranes NECK: Supple, no meningismus, no nuchal rigidity, no LAD  CARD: RRR; S1 and S2 appreciated; no murmurs, no clicks, no rubs, no gallops CHEST:  Patient has a pacemaker in the left chest wall and is mildly tender over this area with a very minimal warmth but no erythema, fluctuance or induration. The surgical incision site is well-healed with no open lesions and no drainage. There is no crepitus or ecchymosis present. RESP: Normal chest excursion without splinting or tachypnea; breath sounds clear and equal bilaterally; no wheezes, no rhonchi, no rales, no hypoxia or respiratory distress, speaking full sentences ABD/GI: Normal bowel sounds; non-distended; soft, non-tender, no rebound, no guarding, no peritoneal signs, no hepatosplenomegaly BACK:  The back appears normal and is non-tender to palpation, there is no CVA tenderness EXT: Normal ROM in all joints; non-tender to palpation; no edema; normal capillary refill; no cyanosis,  no calf tenderness or swelling    SKIN: Normal color for age and race; warm; no rash NEURO: Moves all extremities equally PSYCH: The patient's mood and manner are appropriate. Grooming and personal hygiene are appropriate.  MEDICAL DECISION MAKING: Patient here  with pain over her pacemaker site. Will obtain labs, rectal temperature, troponin. Chest x-ray shows that the pacemaker leads are in place but the pacemaker orientation has changed. We will interrogate her pacemaker. She declines any pain medication at this time. It is slightly warm over this area but there is no obvious sign of cellulitis and no sign of abscess on exam. Anticipate discussion with on-call cardiologist once workup is complete.  ED PROGRESS: 1:45 AM  Rectal temperature is normal. No leukocytosis.  Troponin is negative. Urine shows no sign of infection. Discussed with Dr. Wyline Mood on call for cardiology. Appreciate his help. He agrees that her pain is likely due to movement of the pacemaker. We have discussed this with the [employer] representative he states there have been no abnormal events, no atrial or ventricular tachycardia and pacemaker is working properly. We will have patient follow up closely with her cardiologist as an outpatient.  Patient and husband are comfortable with this plan. She has tramadol that she can take at home. We have discussed return precautions. I have recommended that she avoid lifting anything heavy with her left arm.   At this time, I do not feel there is any life-threatening condition present. I have reviewed and discussed all results (EKG, imaging, lab, urine as appropriate) and exam findings with patient/family. I have reviewed nursing notes and appropriate previous records.  I feel the patient is safe to be discharged home without further emergent workup and can continue workup as an outpatient as needed. Discussed usual and customary return precautions. Patient/family verbalize understanding and are comfortable with this plan.  Outpatient follow-up has been provided if needed. All questions have been answered.      EKG Interpretation  Date/Time:  Monday April 04 2017 21:19:20 EDT Ventricular Rate:  83 PR Interval:  146 QRS Duration: 74 QT  Interval:  364 QTC Calculation: 427 R Axis:   29 Text Interpretation:  Normal sinus rhythm Normal ECG No significant change since last tracing Confirmed by Veleda Mun, Baxter Hire [phone]) on 04/04/2017 11:52:11 PM         Anna Gutierrez, Layla Maw, DO 04/05/17 0156    Anna Gutierrez, Layla Maw, DO 04/05/17 1914

## 2017-04-04 NOTE — Telephone Encounter (Signed)
Patient calling stating that she is febrile to 99.4.  She is s/p PPM on 03/17/17.  Tonight the skin over her pacer site felt tight after laying on her right side in bed for 30 minutes.  It has since improved after getting up moving around.  She took her temp and it was 99.4 but does not feel ill.  Her skin over her pacer site is normal appearing with no erythema.She states that the pacer site feels warm though.  In[facility] to evaluate and make sure she is not devleoping an infection.  Patient agrees with plan

## 2017-04-04 NOTE — ED Notes (Signed)
Pt presents to ED for assessment of pain right around the surgical site of her recently placed pacemaker (July 5th).  Pt states she was laying on her right side, felt like it shifted and she experienced pain.  She felt the site felt warm, so she took her temp at home of 99.2.  Site does not feel warm to touch to this RN.  Denies SOB, denies underlying chest pain, denies n/v, denies any other associated symptoms.  Patient states pain has now subsided.

## 2017-04-05 LAB — URINALYSIS, ROUTINE W REFLEX MICROSCOPIC
Bilirubin Urine: NEGATIVE
Glucose, UA: NEGATIVE mg/dL
Hgb urine dipstick: NEGATIVE
Ketones, ur: NEGATIVE mg/dL
Leukocytes, UA: NEGATIVE
Nitrite: NEGATIVE
Protein, ur: NEGATIVE mg/dL
Specific Gravity, Urine: 1.013 (ref 1.005–1.030)
pH: 6 (ref 5.0–8.0)

## 2017-04-05 LAB — CBC WITH DIFFERENTIAL/PLATELET
Basophils Absolute: 0 10*3/uL (ref 0.0–0.1)
Basophils Relative: 0 %
Eosinophils Absolute: 0.5 10*3/uL (ref 0.0–0.7)
Eosinophils Relative: 5 %
HCT: 41.3 % (ref 36.0–46.0)
HEMOGLOBIN: 13.8 g/dL (ref 12.0–15.0)
LYMPHS ABS: 3.2 10*3/uL (ref 0.7–4.0)
LYMPHS PCT: 34 %
MCH: 27.9 pg (ref 26.0–34.0)
MCHC: 33.4 g/dL (ref 30.0–36.0)
MCV: 83.4 fL (ref 78.0–100.0)
MONOS PCT: 10 %
Monocytes Absolute: 1 10*3/uL (ref 0.1–1.0)
NEUTROS PCT: 51 %
Neutro Abs: 4.7 10*3/uL (ref 1.7–7.7)
Platelets: 272 10*3/uL (ref 150–400)
RBC: 4.95 MIL/uL (ref 3.87–5.11)
RDW: 13.7 % (ref 11.5–15.5)
WBC: 9.5 10*3/uL (ref 4.0–10.5)

## 2017-04-05 LAB — BASIC METABOLIC PANEL
Anion gap: 9 (ref 5–15)
BUN: 13 mg/dL (ref 6–20)
CHLORIDE: 104 mmol/L (ref 101–111)
CO2: 28 mmol/L (ref 22–32)
Calcium: 9.6 mg/dL (ref 8.9–10.3)
Creatinine, Ser: 0.58 mg/dL (ref 0.44–1.00)
GFR calc Af Amer: >60 mL/min (ref >60)
GFR calc non Af Amer: >60 mL/min (ref >60)
GLUCOSE: 94 mg/dL (ref 65–99)
POTASSIUM: 3.9 mmol/L (ref 3.5–5.1)
Sodium: 141 mmol/L (ref 135–145)

## 2017-04-05 LAB — TROPONIN I: Troponin I: <0.03 ng/mL (ref <0.03)

## 2017-04-05 NOTE — ED Notes (Signed)
Pacemaker interrogated. 

## 2017-04-05 NOTE — ED Notes (Signed)
Implanted July 5th, no arrhythmias. Normal battery and pacing leads. Function is normal per [employer]

## 2017-04-05 NOTE — Discharge Instructions (Signed)
Please make an appointment to follow-up with your cardiologist. The pain over the left side of your chest is likely from your pacemaker moving within the pocket. Your pacemaker however is working appropriately and there is no sign of infection today. There is no sign of heart attack or other cardiac abnormality. Myself and cardiology feel you're safe to go home with close outpatient follow-up. You may take Tylenol 1000 mg every 6 hours as needed for pain. If this is not enough to control your pain, you may take your prescription tramadol. I recommend you do not lift anything over 5 pounds using this left arm for the next 2 weeks. If you develop worsening pain, fever of 100.4 higher, difficulty breathing, redness or warmth of the pacemaker site, please return to the hospital or see your cardiologist immediately.

## 2017-04-05 NOTE — Progress Notes (Signed)
Patient discussed with ER staff, presents with discomfort over pacemaker site. Afebrile, normal WBC. From discusion with ER staff site without clear evidence of hematoma or infection. From CXR review her generator has shifted orientation, this likely has led to some discomfort and perhaps some inflammation in the pocket. Early report on device check is normal. If normal function would be ok for discharge from ER. Will forward information to Dr Elberta Fortisamnitz who is her EP physician.  Anna RichJonathan Carely Nappier MD

## 2017-04-05 NOTE — ED Notes (Signed)
Pt verbalized understanding of d/c instructions and has no further questions. Pt is stable, A&Ox4, VSS.  

## 2017-04-06 ENCOUNTER — Telehealth: Payer: Self-pay | Admitting: *Deleted

## 2017-04-06 NOTE — Telephone Encounter (Signed)
Called patient to scheduled OV w/ Camnitz to discuss PPM generator orientation.  Recent CXR showing it has "flipped". Patient inquiring if appt can be made after 8/1 when her Medicare starts. Will forward to Dr. Elberta Fortisamnitz for advisement.

## 2017-04-07 NOTE — Telephone Encounter (Signed)
Advised pt ok to wait until after 8/1. She is scheduled for 8/9. She is agreeable to plan.

## 2017-04-21 ENCOUNTER — Ambulatory Visit (INDEPENDENT_AMBULATORY_CARE_PROVIDER_SITE_OTHER): Payer: Medicare PPO | Admitting: Cardiology

## 2017-04-21 ENCOUNTER — Other Ambulatory Visit: Payer: Self-pay | Admitting: Cardiology

## 2017-04-21 ENCOUNTER — Encounter: Payer: Self-pay | Admitting: Cardiology

## 2017-04-21 VITALS — BP 130/86 | HR 96 | Ht 65.0 in | Wt 160.6 lb

## 2017-04-21 DIAGNOSIS — I495 Sick sinus syndrome: Secondary | ICD-10-CM

## 2017-04-21 DIAGNOSIS — I10 Essential (primary) hypertension: Secondary | ICD-10-CM | POA: Diagnosis not present

## 2017-04-21 LAB — CUP PACEART INCLINIC DEVICE CHECK
Battery Remaining Longevity: 182 mo
Brady Statistic AP VS Percent: 2.43 %
Brady Statistic AS VP Percent: 0.04 %
Brady Statistic RA Percent Paced: 3.34 %
Implantable Lead Implant Date: 20180705
Implantable Lead Model: 5076
Lead Channel Impedance Value: 399 Ohm
Lead Channel Pacing Threshold Pulse Width: 0.4 ms
Lead Channel Pacing Threshold Pulse Width: 0.4 ms
Lead Channel Sensing Intrinsic Amplitude: 0.875 mV
Lead Channel Sensing Intrinsic Amplitude: 10.375 mV
Lead Channel Sensing Intrinsic Amplitude: 3.5 mV
Lead Channel Sensing Intrinsic Amplitude: 7.5 mV
Lead Channel Setting Pacing Amplitude: 3.5 V
Lead Channel Setting Pacing Pulse Width: 0.4 ms
MDC IDC LEAD IMPLANT DT: 20180705
MDC IDC LEAD LOCATION: 753859
MDC IDC LEAD LOCATION: 753860
MDC IDC MSMT BATTERY VOLTAGE: 3.21 V
MDC IDC MSMT LEADCHNL RA IMPEDANCE VALUE: 551 Ohm
MDC IDC MSMT LEADCHNL RA PACING THRESHOLD AMPLITUDE: 1 V
MDC IDC MSMT LEADCHNL RV IMPEDANCE VALUE: 399 Ohm
MDC IDC MSMT LEADCHNL RV IMPEDANCE VALUE: 456 Ohm
MDC IDC MSMT LEADCHNL RV PACING THRESHOLD AMPLITUDE: 0.75 V
MDC IDC PG IMPLANT DT: 20180705
MDC IDC SESS DTM: 20180809142355
MDC IDC SET LEADCHNL RV PACING AMPLITUDE: 3.5 V
MDC IDC SET LEADCHNL RV SENSING SENSITIVITY: 2.8 mV
MDC IDC STAT BRADY AP VP PERCENT: 0.01 %
MDC IDC STAT BRADY AS VS PERCENT: 97.52 %
MDC IDC STAT BRADY RV PERCENT PACED: 0.05 %

## 2017-04-21 LAB — BASIC METABOLIC PANEL
BUN/Creatinine Ratio: 17 (ref 12–28)
BUN: 10 mg/dL (ref 8–27)
CALCIUM: 9.8 mg/dL (ref 8.7–10.3)
CO2: 25 mmol/L (ref 20–29)
CREATININE: 0.6 mg/dL (ref 0.57–1.00)
Chloride: 103 mmol/L (ref 96–106)
GFR, EST AFRICAN AMERICAN: 111 mL/min/{1.73_m2} (ref >59)
GFR, EST NON AFRICAN AMERICAN: 97 mL/min/{1.73_m2} (ref >59)
Glucose: 97 mg/dL (ref 65–99)
Potassium: 4.5 mmol/L (ref 3.5–5.2)
Sodium: 144 mmol/L (ref 134–144)

## 2017-04-21 LAB — CBC
HEMATOCRIT: 44.3 % (ref 34.0–46.6)
HEMOGLOBIN: 15.3 g/dL (ref 11.1–15.9)
MCH: 28 pg (ref 26.6–33.0)
MCHC: 34.5 g/dL (ref 31.5–35.7)
MCV: 81 fL (ref 79–97)
Platelets: 276 10*3/uL (ref 150–379)
RBC: 5.46 x10E6/uL — AB (ref 3.77–5.28)
RDW: 14.8 % (ref 12.3–15.4)
WBC: 7.6 10*3/uL (ref 3.4–10.8)

## 2017-04-21 LAB — PROTIME-INR
INR: 1 (ref 0.8–1.2)
PROTHROMBIN TIME: 10.4 s (ref 9.1–12.0)

## 2017-04-21 NOTE — Patient Instructions (Addendum)
YOU ARE SCHEDULED FOR LEAD REVISION OF YOUR PACEMAKER ON 05-02-17 @ 11:30 AM  ARRIVE AT [facility] AT [facility] @ 9:30 AM THE MORNING OF YOUR PROCEDURE  NOTHING TO EAT OR DRINK AFTER MIDNIGHT Sunday 05-01-17  TAKE ALL MEDICATIONS THE MORNING OF YOUR PROCEDURE WITH SIPS OF WATER  PLAN TO STAY OVERNIGHT  YOU WILL NEED SOMEONE TO DRIVE YOU HOME

## 2017-04-21 NOTE — Progress Notes (Signed)
Electrophysiology Office Note   Date:  04/21/2017   ID:  Anna Gutierrez, DOB 10-Feb-1951, MRN [id number]  PCP:  Ralene Ok, MD  Cardiologist:  Tenny Craw Primary Electrophysiologist:  Ekam Bonebrake Jorja Loa, MD    Chief Complaint  Patient presents with  . Pacemaker Check    Sympomatic bradycardia     History of Present Illness: Anna Gutierrez is a 66 y.o. female who is being seen today for the evaluation of bradycardia, pacemaker at the request of Ralene Ok, MD. Presenting today for electrophysiology evaluation. She has syncopal spell and was found to be bradycardic on arrival to the emergency room with heart rates in the 30s. She had symptom pauses of up to 4 seconds. She had a dual chamber pacemaker implanted on 03/17/17. She re-presented to the emergency room with pain over her pacemaker site. Her pacemaker had rotated, and leads had retracted on her x-ray.    Today, she denies symptoms of palpitations, chest pain, shortness of breath, orthopnea, PND, lower extremity edema, claudication, dizziness, presyncope, syncope, bleeding, or neurologic sequela. The patient is tolerating medications without difficulties.    Past Medical History:  Diagnosis Date  . Hypertension   . Symptomatic bradycardia 03/16/2017  . Thyroid goiter    had radioactive iodine for treatment   Past Surgical History:  Procedure Laterality Date  . CHOLECYSTECTOMY    . PACEMAKER IMPLANT N/A 03/17/2017   Procedure: Pacemaker Implant;  Surgeon: Regan Lemming, MD;  Location: [facility] INVASIVE CV LAB;  Service: Cardiovascular;  Laterality: N/A;  . RECTAL PROLAPSE REPAIR  2016  . TONSILLECTOMY    . TUBAL LIGATION       Current Outpatient Prescriptions  Medication Sig Dispense Refill  . acetaminophen (TYLENOL) 325 MG tablet Take 1-2 tablets (325-650 mg total) by mouth every 4 (four) hours as needed for mild pain.    Marland Kitchen amLODipine-benazepril (LOTREL) 5-10 MG capsule Take 1 capsule by mouth daily.    .  Cholecalciferol (VITAMIN D PO) Take 1 tablet by mouth daily.    . Loperamide HCl (IMODIUM A-D PO) Take 1 capsule by mouth daily as needed (loose stool).     No current facility-administered medications for this visit.     Allergies:   Patient has no known allergies.   Social History:  The patient  reports that she has never smoked. She has never used smokeless tobacco. She reports that she does not drink alcohol or use drugs.   Family History:  The patient's family history includes Arthritis in her sister; COPD in her father; Diabetes in her maternal grandmother and mother; Glaucoma in her sister; Hypertension in her father and sister; Multiple sclerosis in her sister; Renal Disease in her mother.    ROS:  Please see the history of present illness.   Otherwise, review of systems is positive for none.   All other systems are reviewed and negative.    PHYSICAL EXAM: VS:  BP 130/86   Pulse 96   Ht 5\' 5"  (1.651 m)   Wt 160 lb 9.6 oz (72.8 kg)   BMI 26.73 kg/m  , BMI Body mass index is 26.73 kg/m. GEN: Well nourished, well developed, in no acute distress  HEENT: normal  Neck: no JVD, carotid bruits, or masses Cardiac: RRR; no murmurs, rubs, or gallops,no edema  Respiratory:  clear to auscultation bilaterally, normal work of breathing GI: soft, nontender, nondistended, + BS MS: no deformity or atrophy  Skin: warm and dry,  device pocket is well  healed Neuro:  Strength and sensation are intact Psych: euthymic mood, full affect  EKG:  EKG is not ordered today. Personal review of the ekg ordered 7/24/18shows Sinus rhythm, rate 83   Device interrogation is reviewed today in detail.  See PaceArt for details.   Recent Labs: 03/16/2017: Magnesium 2.2; TSH <0.010 04/04/2017: BUN 13; Creatinine, Ser 0.58; Hemoglobin 13.8; Platelets 272; Potassium 3.9; Sodium 141    Lipid Panel  No results found for: CHOL, TRIG, HDL, CHOLHDL, VLDL, LDLCALC, LDLDIRECT   Wt Readings from Last 3  Encounters:  04/21/17 160 lb 9.6 oz (72.8 kg)  03/18/17 158 lb 6.4 oz (71.8 kg)      Other studies Reviewed: Additional studies/ records that were reviewed today include: CXR 04/04/17 - personally reviewed  Review of the above records today demonstrates:  1. Flipped orientation of the subcutaneous pacemaker generator component, although the transvenous leads appear stable in position.  TTE 03/17/17 - Left ventricle: The cavity size was normal. Wall thickness was   increased in a pattern of moderate LVH. Systolic function was   normal. The estimated ejection fraction was in the range of 60%   to 65%. Wall motion was normal; there were no regional wall   motion abnormalities. Doppler parameters are consistent with   abnormal left ventricular relaxation (grade 1 diastolic   dysfunction). - Left atrium: The atrium was mildly dilated. - Pulmonary arteries: Systolic pressure was moderately increased.   PA peak pressure: 57 mm Hg (S).  ASSESSMENT AND PLAN:  1.  Sick sinus syndrome: Status post Medtronic dual chamber pacemaker. Pacemaker has twisted in the pocket and leads have retracted. We'll plan for lead revision. Risks and benefits were discussed. Risks include bleeding, tamponade, infection, and pneumothorax, among others. She understands these risks and has agreed to the procedure.   2. Hypertension: well controlled today. No changes at this time.    Current medicines are reviewed at length with the patient today.   The patient does not have concerns regarding her medicines.  The following changes were made today:  none  Labs/ tests ordered today include:  No orders of the defined types were placed in this encounter.    Disposition:   FU with Bodin Gorka 3 months  Signed, Zafirah Vanzee Jorja LoaMartin Camren Lipsett, MD  04/21/2017 12:09 PM     [facility] [address] [place] [address] [phone] (office) [phone] (fax)

## 2017-04-25 ENCOUNTER — Ambulatory Visit: Payer: Self-pay | Admitting: "Endocrinology

## 2017-05-02 ENCOUNTER — Ambulatory Visit (HOSPITAL_COMMUNITY)
Admission: RE | Disposition: A | Discharge status: Home / Self Care | Payer: Self-pay | Source: Ambulatory Visit | Attending: Cardiology

## 2017-05-02 ENCOUNTER — Ambulatory Visit (HOSPITAL_COMMUNITY)
Admission: RE | Admit: 2017-05-02 | Discharge: 2017-05-03 | Disposition: A | Discharge status: Home / Self Care | Payer: Medicare PPO | Source: Ambulatory Visit | Attending: Cardiology | Admitting: Cardiology

## 2017-05-02 DIAGNOSIS — I495 Sick sinus syndrome: Secondary | ICD-10-CM | POA: Diagnosis present

## 2017-05-02 DIAGNOSIS — I10 Essential (primary) hypertension: Secondary | ICD-10-CM | POA: Diagnosis not present

## 2017-05-02 DIAGNOSIS — Y838 Other surgical procedures as the cause of abnormal reaction of the patient, or of later complication, without mention of misadventure at the time of the procedure: Secondary | ICD-10-CM | POA: Insufficient documentation

## 2017-05-02 DIAGNOSIS — Y9289 Other specified places as the place of occurrence of the external cause: Secondary | ICD-10-CM | POA: Insufficient documentation

## 2017-05-02 DIAGNOSIS — T82120A Displacement of cardiac electrode, initial encounter: Secondary | ICD-10-CM | POA: Diagnosis not present

## 2017-05-02 DIAGNOSIS — Z95818 Presence of other cardiac implants and grafts: Secondary | ICD-10-CM

## 2017-05-02 HISTORY — PX: LEAD REVISION/REPAIR: EP1213

## 2017-05-02 LAB — SURGICAL PCR SCREEN
MRSA, PCR: NEGATIVE
Staphylococcus aureus: NEGATIVE

## 2017-05-02 SURGERY — LEAD REVISION/REPAIR
Anesthesia: LOCAL

## 2017-05-02 MED ORDER — MUPIROCIN 2 % EX OINT
TOPICAL_OINTMENT | CUTANEOUS | Status: AC
  Administered 2017-05-02: 1 via TOPICAL
  Filled 2017-05-02: qty 22

## 2017-05-02 MED ORDER — LIDOCAINE HCL (PF) 1 % IJ SOLN
INTRAMUSCULAR | Status: AC
  Filled 2017-05-02: qty 60

## 2017-05-02 MED ORDER — LOPERAMIDE HCL 2 MG PO CAPS: 6.0000 mg | ORAL_CAPSULE | Freq: Four times a day (QID) | ORAL | Status: DC | PRN

## 2017-05-02 MED ORDER — LIDOCAINE HCL (PF) 1 % IJ SOLN
INTRAMUSCULAR | Status: DC | PRN
  Administered 2017-05-02: 45 mL

## 2017-05-02 MED ORDER — CEFAZOLIN SODIUM-DEXTROSE 1-4 GM/50ML-% IV SOLN
1.0000 g | Freq: Four times a day (QID) | INTRAVENOUS | Status: AC
  Administered 2017-05-02 – 2017-05-03 (×3): 1 g via INTRAVENOUS
  Filled 2017-05-02 (×3): qty 50

## 2017-05-02 MED ORDER — ONDANSETRON HCL 4 MG/2ML IJ SOLN: 4.0000 mg | Freq: Four times a day (QID) | INTRAMUSCULAR | Status: DC | PRN

## 2017-05-02 MED ORDER — SODIUM CHLORIDE 0.9 % IR SOLN
Status: AC
  Filled 2017-05-02: qty 2

## 2017-05-02 MED ORDER — HEPARIN (PORCINE) IN NACL 2-0.9 UNIT/ML-% IJ SOLN: INTRAMUSCULAR | Status: DC | PRN

## 2017-05-02 MED ORDER — HEPARIN (PORCINE) IN NACL 2-0.9 UNIT/ML-% IJ SOLN
INTRAMUSCULAR | Status: AC
  Filled 2017-05-02: qty 500

## 2017-05-02 MED ORDER — SODIUM CHLORIDE 0.9 % IR SOLN
80.0000 mg | Status: AC
  Administered 2017-05-02: 80 mg

## 2017-05-02 MED ORDER — CEFAZOLIN SODIUM-DEXTROSE 2-4 GM/100ML-% IV SOLN
2.0000 g | INTRAVENOUS | Status: AC
  Administered 2017-05-02: 2 g via INTRAVENOUS

## 2017-05-02 MED ORDER — FENTANYL CITRATE (PF) 100 MCG/2ML IJ SOLN
INTRAMUSCULAR | Status: DC | PRN
  Administered 2017-05-02 (×2): 25 ug via INTRAVENOUS

## 2017-05-02 MED ORDER — MIDAZOLAM HCL 5 MG/5ML IJ SOLN
INTRAMUSCULAR | Status: AC
  Filled 2017-05-02: qty 5

## 2017-05-02 MED ORDER — FENTANYL CITRATE (PF) 100 MCG/2ML IJ SOLN
INTRAMUSCULAR | Status: AC
  Filled 2017-05-02: qty 2

## 2017-05-02 MED ORDER — CEFAZOLIN SODIUM-DEXTROSE 2-4 GM/100ML-% IV SOLN
INTRAVENOUS | Status: AC
  Filled 2017-05-02: qty 100

## 2017-05-02 MED ORDER — ACETAMINOPHEN 325 MG PO TABS
325.0000 mg | ORAL_TABLET | ORAL | Status: DC | PRN
  Administered 2017-05-02 – 2017-05-03 (×3): 650 mg via ORAL
  Filled 2017-05-02 (×3): qty 2

## 2017-05-02 MED ORDER — MIDAZOLAM HCL 5 MG/5ML IJ SOLN
INTRAMUSCULAR | Status: DC | PRN
  Administered 2017-05-02 (×2): 1 mg via INTRAVENOUS

## 2017-05-02 MED ORDER — ACETAMINOPHEN 325 MG PO TABS: 325.0000 mg | ORAL_TABLET | ORAL | Status: DC | PRN

## 2017-05-02 MED ORDER — MUPIROCIN 2 % EX OINT
1.0000 "application " | TOPICAL_OINTMENT | Freq: Once | CUTANEOUS | Status: AC
  Administered 2017-05-02: 1 via TOPICAL

## 2017-05-02 MED ORDER — TRAMADOL HCL 50 MG PO TABS
50.0000 mg | ORAL_TABLET | Freq: Three times a day (TID) | ORAL | Status: DC | PRN
  Administered 2017-05-02 – 2017-05-03 (×2): 50 mg via ORAL
  Filled 2017-05-02 (×2): qty 1

## 2017-05-02 MED ORDER — VITAMIN D 1000 UNITS PO TABS
1000.0000 [IU] | ORAL_TABLET | Freq: Every day | ORAL | Status: DC
  Administered 2017-05-03: 1000 [IU] via ORAL
  Filled 2017-05-02: qty 1

## 2017-05-02 MED ORDER — SODIUM CHLORIDE 0.9 % IV SOLN
INTRAVENOUS | Status: DC
  Administered 2017-05-02: 11:00:00 via INTRAVENOUS

## 2017-05-02 SURGICAL SUPPLY — 3 items
CABLE SURGICAL S-101-97-12 (CABLE) ×2 IMPLANT
PAD DEFIB LIFELINK (PAD) ×2 IMPLANT
TRAY PACEMAKER INSERTION (PACKS) ×2 IMPLANT

## 2017-05-02 NOTE — Discharge Instructions (Signed)
° ° °  Supplemental Discharge Instructions for  °Pacemaker/Defibrillator Patients ° °Activity °No heavy lifting or vigorous activity with your left/right arm for 6 to 8 weeks.  Do not raise your left/right arm above your head for one week.  Gradually raise your affected arm as drawn below. ° °        °    05/06/17                     05/07/17                    05/08/17                  05/09/17 °__ ° °NO DRIVING for  1 week   ; you may begin driving on 05/09/17  . ° °WOUND CARE °- Keep the wound area clean and dry.  Do not get this area wet, no showers until cleared to at your wound check visit . °- The tape/steri-strips on your wound will fall off; do not pull them off.  No bandage is needed on the site.  DO  NOT apply any creams, oils, or ointments to the wound area. °- If you notice any drainage or discharge from the wound, any swelling or bruising at the site, or you develop a fever > 101? F after you are discharged home, call the office at once. ° °Special Instructions °- You are still able to use cellular telephones; use the ear opposite the side where you have your pacemaker/defibrillator.  Avoid carrying your cellular phone near your device. °- When traveling through airports, show security personnel your identification card to avoid being screened in the metal detectors.  Ask the security personnel to use the hand wand. °- Avoid arc welding equipment, MRI testing (magnetic resonance imaging), TENS units (transcutaneous nerve stimulators).  Call the office for questions about other devices. °- Avoid electrical appliances that are in poor condition or are not properly grounded. °- Microwave ovens are safe to be near or to operate. ° °Additional information for defibrillator patients should your device go off: °- If your device goes off ONCE and you feel fine afterward, notify the device clinic nurses. °- If your device goes off ONCE and you do not feel well afterward, call 911. °- If your device goes off TWICE,  call 911. °- If your device goes off THREE times in one day, call 911. ° °DO NOT DRIVE YOURSELF OR A FAMILY MEMBER °WITH A DEFIBRILLATOR TO THE HOSPITAL--CALL 911. ° °

## 2017-05-02 NOTE — H&P (Addendum)
Anna Gutierrez is a 66 y.o. female with a history of sick sinus syndrome and syncope s/p medtronic dual chamber pacemaker. She had pain at her pacemaker site and the device has moved with retraction of the leads. On exam, RRR, no murmurs, lungs clear She presents today for lead revision. Risks and benefits discussed. Risks include but not limited to bleeding, infection, tamponade, pneumothorax. She understands the risks and has agreed to the procedure.  Duquan Gillooly Elberta Fortis, MD 05/02/2017 10:10 AM

## 2017-05-03 ENCOUNTER — Ambulatory Visit (HOSPITAL_COMMUNITY): Payer: Medicare PPO

## 2017-05-03 ENCOUNTER — Encounter (HOSPITAL_COMMUNITY): Payer: Self-pay | Admitting: Cardiology

## 2017-05-03 DIAGNOSIS — I495 Sick sinus syndrome: Secondary | ICD-10-CM | POA: Diagnosis not present

## 2017-05-03 DIAGNOSIS — T82120A Displacement of cardiac electrode, initial encounter: Secondary | ICD-10-CM | POA: Diagnosis not present

## 2017-05-03 DIAGNOSIS — I10 Essential (primary) hypertension: Secondary | ICD-10-CM | POA: Diagnosis not present

## 2017-05-03 MED FILL — Heparin Sodium (Porcine) 2 Unit/ML in Sodium Chloride 0.9%: INTRAMUSCULAR | Qty: 500 | Status: AC

## 2017-05-03 NOTE — Discharge Summary (Signed)
ELECTROPHYSIOLOGY PROCEDURE DISCHARGE SUMMARY    Patient ID: Anna Gutierrez,  MRN: [id number], DOB/AGE: 1950-11-04 66 y.o.  Admit date: 05/02/2017 Discharge date: 05/03/2017  Primary Care Physician: Ralene Ok, MD Primary Cardiologist: Tenny Craw Electrophysiologist: Elberta Fortis  Primary Discharge Diagnosis:  Pacemaker lead dislodgement s/p lead revision this admission  Secondary Discharge Diagnosis:  1.  SSS 2.  HTN  No Known Allergies   Procedures This Admission:  1.  Right atrial and right ventricular lead revision on 05/02/17 by Dr Elberta Fortis.  See op note for full details. There were no immediate post procedure complications. 2.  CXR on 05/03/17 demonstrated no pneumothorax status post device implantation.   Brief HPI: Anna Gutierrez is a 66 y.o. female s/p PPM implant for sick sinus syndrome.  She has been found to have dislodged pacemaker leads with leads retracted.  Risks, benefits, and alternatives to PPM lead revision were reviewed with the patient who wished to proceed.   Hospital Course:  The patient was admitted and underwent lead revision with details as outlined above.  She  was monitored on telemetry overnight which demonstrated sinus rhythm.  Left chest was without hematoma or ecchymosis.  The device was interrogated and found to be functioning normally.  CXR was obtained and demonstrated no pneumothorax status post device implantation.  Wound care, arm mobility, and restrictions were reviewed with the patient.  The patient was examined and considered stable for discharge to home.    Physical Exam: Vitals:   05/02/17 1625 05/02/17 1657 05/02/17 2025 05/03/17 0448  BP: 115/74 110/66 124/65 126/66  Pulse: 79 80 89 79  Resp: 14 14 16 16   Temp:   98 F (36.7 C) 98 F (36.7 C)  TempSrc:   Oral Oral  SpO2: 96% 98% 97% 95%  Weight:    158 lb (71.7 kg)  Height:        GEN- The patient is well appearing, alert and oriented x 3 today.   HEENT: normocephalic,  atraumatic; sclera clear, conjunctiva pink; hearing intact; oropharynx clear; neck supple Lungs- Clear to ausculation bilaterally, normal work of breathing.  No wheezes, rales, rhonchi Heart- Regular rate and rhythm, no murmurs, rubs or gallops GI- soft, non-tender, non-distended, bowel sounds present Extremities- no clubbing, cyanosis, or edema; DP/PT/radial pulses 2+ bilaterally MS- no significant deformity or atrophy Skin- warm and dry, no rash or lesion, left chest without hematoma/ecchymosis Psych- euthymic mood, full affect Neuro- strength and sensation are intact   Labs:   Lab Results  Component Value Date   WBC 7.6 04/21/2017   HGB 15.3 04/21/2017   HCT 44.3 04/21/2017   MCV 81 04/21/2017   PLT 276 04/21/2017   No results for input(s): NA, K, CL, CO2, BUN, CREATININE, CALCIUM, PROT, BILITOT, ALKPHOS, ALT, AST, GLUCOSE in the last 168 hours.  Invalid input(s): LABALBU  Discharge Medications:  Allergies as of 05/03/2017   No Known Allergies     Medication List    TAKE these medications   acetaminophen 325 MG tablet Commonly known as:  TYLENOL Take 1-2 tablets (325-650 mg total) by mouth every 4 (four) hours as needed for mild pain.   amLODipine-benazepril 5-10 MG capsule Commonly known as:  LOTREL Take 1 capsule by mouth every evening.   cholecalciferol 1000 units tablet Commonly known as:  VITAMIN D Take 1,000 Units by mouth daily.   loperamide 2 MG capsule Commonly known as:  IMODIUM Take 6 mg by mouth 4 (four) times daily as needed for  diarrhea or loose stools.       Disposition:  Discharge Instructions    Diet - low sodium heart healthy    Complete by:  As directed    Increase activity slowly    Complete by:  As directed      Follow-up Information    [facility] Office Follow up on 05/11/2017.   Specialty:  Cardiology Why:  3:00PM, wound check visit Contact information: [address], [address] [place]  [address] [phone]       Regan Lemming, MD Follow up on 08/02/2017.   Specialty:  Cardiology Why:  11:15AM Contact information: [address] [place] [address] [phone]           Duration of Discharge Encounter: Greater than 30 minutes including physician time.  Signed, Gypsy Balsam, NP 05/03/2017 5:10 AM  I have seen and examined this patient with Gypsy Balsam.  Agree with above, note added to reflect my findings.  On exam, RRR, no murmurs, lungs clear. Lead revision performed yesterday. Procedure without issue. Device interrogation and chest x-ray without abnormality. Plan for discharge today with follow-up in clinic in 10 days.  Will M. Camnitz MD 05/03/2017 8:27 AM

## 2017-05-03 NOTE — Plan of Care (Signed)
Problem: Cardiac: Goal: Ability to achieve and maintain adequate cardiopulmonary perfusion will improve Outcome: Progressing EKG completed this morning. Patient due to have CXR and pacer interrogated. Minimal advancement of serosang drainage noted to dressing. Pain at site has improved throughout the shift.

## 2017-05-09 ENCOUNTER — Telehealth: Payer: Self-pay | Admitting: Endocrinology

## 2017-05-09 NOTE — Telephone Encounter (Signed)
Dr. Gardenia Phlegm office needs the patient's new patient office notes faxed to [phone]. Call to advise if necessary

## 2017-05-10 NOTE — Telephone Encounter (Signed)
Routing to you °

## 2017-05-10 NOTE — Telephone Encounter (Signed)
Does anyone know where these are?

## 2017-05-10 NOTE — Telephone Encounter (Signed)
Do they know that I am not seeing the patient until next month

## 2017-05-11 ENCOUNTER — Ambulatory Visit (INDEPENDENT_AMBULATORY_CARE_PROVIDER_SITE_OTHER): Payer: Medicare PPO | Admitting: *Deleted

## 2017-05-11 DIAGNOSIS — R001 Bradycardia, unspecified: Secondary | ICD-10-CM | POA: Diagnosis not present

## 2017-05-11 DIAGNOSIS — Z950 Presence of cardiac pacemaker: Secondary | ICD-10-CM | POA: Diagnosis not present

## 2017-05-11 DIAGNOSIS — I495 Sick sinus syndrome: Secondary | ICD-10-CM | POA: Diagnosis not present

## 2017-05-11 LAB — CUP PACEART INCLINIC DEVICE CHECK
Battery Voltage: 3.21 V
Brady Statistic AP VP Percent: 0.02 %
Brady Statistic AP VS Percent: 1.11 %
Brady Statistic AS VS Percent: 98.84 %
Brady Statistic RA Percent Paced: 1.5 %
Brady Statistic RV Percent Paced: 0.06 %
Date Time Interrogation Session: 20180829154600
Implantable Lead Implant Date: 20180705
Implantable Lead Location: 753859
Implantable Lead Location: 753860
Implantable Lead Model: 5076
Lead Channel Impedance Value: 399 Ohm
Lead Channel Impedance Value: 475 Ohm
Lead Channel Pacing Threshold Amplitude: 1 V
Lead Channel Pacing Threshold Pulse Width: 0.4 ms
Lead Channel Pacing Threshold Pulse Width: 0.4 ms
Lead Channel Sensing Intrinsic Amplitude: 1.125 mV
Lead Channel Setting Pacing Amplitude: 3.5 V
Lead Channel Setting Sensing Sensitivity: 2.8 mV
MDC IDC LEAD IMPLANT DT: 20180705
MDC IDC MSMT BATTERY REMAINING LONGEVITY: 183 mo
MDC IDC MSMT LEADCHNL RA IMPEDANCE VALUE: 361 Ohm
MDC IDC MSMT LEADCHNL RA IMPEDANCE VALUE: 646 Ohm
MDC IDC MSMT LEADCHNL RV PACING THRESHOLD AMPLITUDE: 0.75 V
MDC IDC MSMT LEADCHNL RV SENSING INTR AMPL: 11.75 mV
MDC IDC PG IMPLANT DT: 20180705
MDC IDC SET LEADCHNL RA PACING AMPLITUDE: 3.5 V
MDC IDC SET LEADCHNL RV PACING PULSEWIDTH: 0.4 ms
MDC IDC STAT BRADY AS VP PERCENT: 0.04 %

## 2017-05-11 NOTE — Progress Notes (Signed)
Wound check appointment. Steri-strips removed. Wound without redness or edema. Incision edges approximated and well healed with exception of small, superficially unapproximated area along mid-incision. No drainage or stitch noted. Patient instructed to wash site with antibacterial soap and water daily and call if any signs/symptoms of infection. Normal device function. Thresholds, sensing, and impedances consistent with implant measurements. Device programmed at 3.5V with auto capture programmed on for extra safety margin until 3 month visit. RA/RV acute phase remaining extended to 90 days due to lead revision on 05/02/17. Histogram distribution appropriate for patient and level of activity. No mode switches or high ventricular rates noted. Patient educated about wound care, arm mobility, lifting restrictions, and Carelink monitor. ROV with WC on 08/02/17.

## 2017-05-11 NOTE — Telephone Encounter (Signed)
I called the number provided and it is a fax line...Marland Kitchen..Marland Kitchen

## 2017-05-11 NOTE — Telephone Encounter (Signed)
I called the number again and was able to get someone and advised them that the patient will not be seen for another month.

## 2017-06-08 ENCOUNTER — Ambulatory Visit (INDEPENDENT_AMBULATORY_CARE_PROVIDER_SITE_OTHER): Payer: Medicare PPO | Admitting: Endocrinology

## 2017-06-08 ENCOUNTER — Encounter: Payer: Self-pay | Admitting: Endocrinology

## 2017-06-08 VITALS — BP 138/84 | HR 91 | Ht 65.0 in | Wt 158.4 lb

## 2017-06-08 DIAGNOSIS — E0590 Thyrotoxicosis, unspecified without thyrotoxic crisis or storm: Secondary | ICD-10-CM

## 2017-06-08 LAB — T4, FREE: FREE T4: 1.97 ng/dL — AB (ref 0.60–1.60)

## 2017-06-08 LAB — T3, FREE: T3, Free: 7.3 pg/mL — ABNORMAL HIGH (ref 2.3–4.2)

## 2017-06-08 LAB — TSH: TSH: <0.01 u[IU]/mL — ABNORMAL LOW (ref 0.35–4.50)

## 2017-06-08 NOTE — Progress Notes (Addendum)
Patient ID: Anna Gutierrez  DOB: December 09, 1950, 66 y.o.   MRN: [id number]                                                                                                              Reason for Appointment:  Hyperthyroidism, new consultation  Referring physician: Ludwig Clarks   Chief complaint: Overactive thyroid   History of Present Illness:   The patient denies any symptoms of palpitations, shakiness, feeling excessively warm and sweaty, nervousness or weight loss  For the last several months she does not think she has as much stamina and is not able to walk as much as she can because of fatigue She does not think she has any muscle weakness with going up stairs or getting up from squatting She has not had any change in her bowel habits or appetite  Patient had a screening thyroid test done when she was admitted in July for symptomatic bradycardia and was found to be hyperthyroid She was discharged on 5 mg methimazole but she did not feel any different with this She has not taken this for the last 6 weeks as she did not have a prescription  She still does not think she feels any of the above symptoms of hyperthyroidism Her labs done by PCP in August showed a high normal total T4 and mildly increased T3 level of 212 with low TSH Previously in 2015 her TSH had been normal  BACKGROUND information:  At the age of 68 patient apparently had hyperthyroidism diagnosed when she had a prominent thyroid in the front of her neck.  She also thinks she may have had weight loss, palpitations, jitteriness at that time She was treated with radioactive iodine treatment and subsequently was started on thyroid was normal  Wt Readings from Last 3 Encounters:  06/08/17 158 lb 6.4 oz (71.8 kg)  05/03/17 158 lb (71.7 kg)  04/21/17 160 lb 9.6 oz (72.8 kg)      Thyroid function tests as follows:     Lab Results  Component Value Date   FREET4 1.81 (H) 03/16/2017   TSH <0.010 (L) 03/16/2017     No results found for: THYROTRECAB   Allergies as of 06/08/2017   No Known Allergies     Medication List       Accurate as of 06/08/17  9:02 AM. Always use your most recent med list.          acetaminophen 325 MG tablet Commonly known as:  TYLENOL Take 1-2 tablets (325-650 mg total) by mouth every 4 (four) hours as needed for mild pain.   amLODipine-benazepril 5-10 MG capsule Commonly known as:  LOTREL Take 1 capsule by mouth every evening.   cholecalciferol 1000 units tablet Commonly known as:  VITAMIN D Take 1,000 Units by mouth daily.   loperamide 2 MG capsule Commonly known as:  IMODIUM Take 6 mg by mouth 4 (four) times daily as needed for diarrhea or loose stools.           Past  Medical History:  Diagnosis Date  . Hypertension   . Symptomatic bradycardia 03/16/2017  . Thyroid goiter    had radioactive iodine for treatment    Past Surgical History:  Procedure Laterality Date  . CHOLECYSTECTOMY    . LEAD REVISION/REPAIR N/A 05/02/2017   Procedure: Lead Revision/Repair;  Surgeon: Regan Lemming, MD;  Location: [facility] INVASIVE CV LAB;  Service: Cardiovascular;  Laterality: N/A;  . PACEMAKER IMPLANT N/A 03/17/2017   Procedure: Pacemaker Implant;  Surgeon: Regan Lemming, MD;  Location: [facility] INVASIVE CV LAB;  Service: Cardiovascular;  Laterality: N/A;  . RECTAL PROLAPSE REPAIR  2016  . TONSILLECTOMY    . TUBAL LIGATION      Family History  Problem Relation Age of Onset  . Renal Disease Mother   . Diabetes Mother   . COPD Father   . Hypertension Father   . Arthritis Sister   . Diabetes Maternal Grandmother   . Glaucoma Sister   . Hypertension Sister   . Multiple sclerosis Sister     Social History:  reports that she has never smoked. She has never used smokeless tobacco. She reports that she does not drink alcohol or use drugs.  Allergies: No Known Allergies   Review of Systems  Constitutional: Negative for weight loss.  HENT: Negative for  trouble swallowing.   Respiratory: Negative for shortness of breath.   Cardiovascular: Negative for palpitations and leg swelling.  Gastrointestinal: Positive for constipation.       She has relatively hard stools but moves frequently because of history of rectal prolapse surgery  Endocrine: Positive for fatigue. Negative for heat intolerance.  Genitourinary: Negative for frequency.  Musculoskeletal: Positive for joint pain.  Skin: Negative for rash.  Neurological: Positive for weakness.       Examination:   BP 138/84   Pulse 91   Ht  (1.651 m)   Wt 158 lb 6.4 oz (71.8 kg)   SpO2 96%   BMI 26.36 kg/m    General Appearance:  well-built and nourished, pleasant, not anxious or hyperkinetic.        Eyes: No excessive prominence, lid lag or stare present. No swelling of the eyelids or ecchymosis present   Neck: The thyroid is enlarged Nearly 2 times normal on the left, nodular and for him Right side is about 1-1/2 times normal and smooth There is no lymphadenopathy in the neck .           Heart: normal S1 and S2, no murmurs .          Lungs: breath sounds are clear bilaterally Abdomen: no hepatosplenomegaly or other palpable abnormality  Extremities: hands are Not unusually warm. No ankle edema. She has mild limitation of abduction of the right arm overhead  Neurological:  Power in her lower extremities is 4/5 proximally No fine tremors are present. Deep tendon reflexes at biceps are Slightly brisk.  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  Will check her thyrotropin receptor  antibody today and if it is negative will proceed with thyroid scan and uptake If she has a multinodular goiter most likely will need I-131 treatment, otherwise may respond to methimazole  Patient handout on hyperthyroidism and radioactive iodine treatment given Patient understands the above discussion and treatment options. All questions were answered satisfactorily  Consult note sent to referring physician  [facility] 06/08/2017, 9:02 AM    Note: This office note was prepared with Dragon voice recognition system technology. Any transcriptional errors that result from this process are unintentional.  ADDENDUM: Patient is still frankly hyperthyroid on her labs with a high thyrotropin receptor antibody Will recommend trial of methimazole 10 mg daily  Office Visit on 06/08/2017  Component Date Value Ref Range Status  . TSH 06/08/2017 <0.01* 0.35 - 4.50 uIU/mL Final  . Free T4 06/08/2017 1.97* 0.60 - 1.60 ng/dL Final   Comment: Specimens from patients who are undergoing biotin therapy and /or ingesting biotin supplements may contain high levels of biotin.  The higher biotin concentration in these specimens interferes with this Free T4 assay.  Specimens that contain high levels  of biotin may cause false high results for this Free T4 assay.  Please interpret results in light of the total clinical presentation of the patient.    . T3, Free 06/08/2017 7.3* 2.3 - 4.2 pg/mL Final  . Thyrotropin Receptor Ab 06/08/2017 34.01* 0.00 - 1.75 IU/L Final

## 2017-06-08 NOTE — Patient Instructions (Signed)
What is hyperthyroidism?  Hyperthyroidism develops when the body is exposed to excessive amounts of thyroid hormone. This disorder occurs in almost one percent of all Americans and affects women five to 10 times more often than men. In its mildest form, hyperthyroidism may not cause recognizable symptoms. More often, however, the symptoms are discomforting, disabling or even life-threatening.  What are the causes of hyperthyroidism?  . Graves' disease: Graves' disease (named after Irish physician Robert Graves) is an autoimmune disorder that frequently results in thyroid enlargement and hyperthyroidism. In some patients, swelling of the muscles and other tissues around the eyes may develop, causing eye prominence, discomfort or double vision. Like other autoimmune diseases, this condition tends to affect multiple family members. It is much more common in women than in men and tends to occur in younger patients. . Postpartum thyroiditis: Five to 10 percent of women develop mild to moderate hyperthyroidism within several months of giving birth. Hyperthyroidism in this condition usually lasts for approximately one to two months. It is often followed by several months of hypothyroidism, but most women will eventually recover normal thyroid function. In some cases, however, the thyroid gland does not heal, so the hypothyroidism becomes permanent and requires lifelong thyroid hormone replacement. This condition may occur again with subsequent pregnancies. . Silent thyroiditis: Transient (temporary) hyperthyroidism can be caused by silent thyroiditis, a condition which appears to be the same as postpartum thyroiditis, but is not related to pregnancy. It is not accompanied by a painful thyroid gland. . Subacute thyroiditis: This condition may follow a viral infection and is characterized by painful thyroid gland enlargement and inflammation, which results in the release of large amounts of thyroid hormones into the  blood. Fortunately, this condition usually resolves spontaneously. The thyroid usually heals itself over several months, but often not before a temporary period of low thyroid hormone production (hypothyroidism) occurs. . Toxic multinodular goiter: Multiple nodules in the thyroid can produce excessive thyroid hormone, causing hyperthyroidism. Typically diagnosed in patients over the age of 50, this disorder is more likely to affect heart rhythm. In many cases, the person has had the goiter for many years before it becomes overactive. . Toxic nodule: A single nodule or lump in the thyroid can also produce more thyroid hormone than the body requires and lead to hyperthyroidism. This disorder is not familial. . Excessive iodine ingestion: Various sources of high iodine concentrations, such as kelp tablets, some expectorants, amiodarone (Cordarone, Pacerone - medications used to treat certain problems with heart rhythms) and x-ray dyes may occasionally cause hyperthyroidism in patients who are prone to it. . Overmedication with thyroid hormone: Patients who receive excessive thyroxine replacement treatment can develop hyperthyroidism. They should have their thyroid hormone dosage evaluated by a physician at least once each year and should NEVER give themselves "extra" doses.   What are the signs and symptoms of hyperthyroidism? When hyperthyroidism develops, a goiter (enlargement of the thyroid) is usually present and may be associated with some or many of the following features: . Fast heart rate, often more than 100 beats per minute . Becoming anxious, irritable, argumentative . Trembling hands . Weight loss, despite eating the same amount or even more than usual . Intolerance of warm temperatures and increased likelihood to perspire . Loss of scalp hair . Tendency of fingernails to separate from the nail bed . Muscle weakness, especially of the upper arms and thighs . Loose and frequent bowel  movements . Smooth skin . Change in menstrual pattern . Increased   likelihood for miscarriage . Prominent "stare" of the eyes . Protrusion of the eyes, with or without double vision (in patients with Graves' disease) . Irregular heart rhythm, especially in patients older than 65 years of age . Accelerated loss of calcium from bones, which increases the risk of osteoporosis and fractures   How is hyperthyroidism diagnosed? Sometimes a general physician can diagnose and treat the cause of hyperthyroidism, but assistance is often needed from an endocrinologist, a physician who specializes in managing thyroid disease. Characteristic symptoms and physical signs of the disease can be detected by a trained physician. In addition, tests can be used to confirm the diagnosis and to determine the cause.  Tests TSH (THYROID-STIMULATING HORMONE OR THYROTROPIN): A low TSH level in the blood is the most accurate indicator of hyperthyroidism. The body shuts off production of this pituitary hormone when the thyroid gland even slightly overproduces thyroid hormone. If the TSH level is low, it is very important to also check thyroid hormone levels to confirm the diagnosis of hyperthyroidism.  ESTIMATES OF FREE THYROXINE AND FREE TRIIODOTHYRONINE: When hyperthyroidism develops, free thyroxine and free triiodothyronine levels rise above previous values in that specific patient (although they may still fall within the normal range for the general population) and are often considerably elevated. TSI (THYROID-STIMULATING IMMUNOGLOBULIN): A substance often found in the blood when Graves' disease is the cause of hyperthyroidism. RADIOACTIVE IODINE UPTAKE (RAIU): The amount of iodine the thyroid gland can collect, and a thyroid scan, which shows how the iodine is distributed throughout the thyroid gland.  THYROID SCAN: This information can be useful in determining the cause of hyperthyroidism and, ultimately, its  treatment.   How is hyperthyroidism treated? Appropriate management of hyperthyroidism requires careful evaluation and ongoing care by a physician experienced in the treatment of this complex condition. Before the development of current treatment options, the death rate from severe hyperthyroidism was as high as 50 percent. Now several effective treatments are available and, with proper management, death from hyperthyroidism is rare. Deciding which treatment is best depends on what caused the hyperthyroidism, its severity and other conditions present.   . Antithyroid Drugs In the [place], two drugs are available for treating hyperthyroidism: propylthiouracil (PTU) and methimazole (Tapazole). Except for early pregnancy, methimazole is preferred because PTU can cause fatal liver damage, although rarely. These medications control hyperthyroidism by slowing thyroid hormone production. They may take several months to normalize thyroid hormone levels. Some patients with hyperthyroidism caused by Graves' disease experience a spontaneous or natural remission of hyperthyroidism after a 12- to 18-month course of treatment with these drugs and may sometimes avoid permanent underactivity of the thyroid (hypothyroidism), which often occurs as a result of using the other methods of treating hyperthyroidism. Unfortunately, the remission is frequently only temporary, with the hyperthyroidism recurring after several months or years off medication and requiring additional treatment, so relatively few patients are treated solely with antithyroid medication in the [place]. Antithyroid drugs may cause an allergic reaction in about five percent of patients who use them. This usually occurs during the first six weeks of drug treatment. Such a reaction may include rash or hives; but after discontinuing use of the drug, the symptoms resolve within one to two weeks and there is no permanent damage. A more serious side  effect, but occurring in only about one in 250-500 patients during the first four to eight weeks of treatment, is a rapid decrease of white blood cells in the bloodstream. This could increase   susceptibility to serious infection. Symptoms such as a sore throat, infection or fever should be reported promptly to your physician, and a white blood cell count should be done immediately. In nearly every case, when a person stops using the medication, the white blood cell count returns to normal. Very rarely, antithyroid drugs may cause severe liver problems, which can be detected by monitoring blood tests or joint problems characterized by joint pain and/or swelling. Your physician should be contacted if there is yellowing of the skin (jaundice), fever, loss of appetite or abdominal pain.  . Radioactive Iodine Treatment Iodine is an essential ingredient in the production of thyroid hormone. Each molecule of thyroid hormone contains either four (T4) or three (T3) molecules of iodine. Since most overactive thyroid glands are quite hungry for iodine, it was discovered in the 1940s that the thyroid could be "tricked" into destroying itself by simply feeding it radioactive iodine. The radioactive iodine is given by mouth, usually in capsule form, and is quickly absorbed from the bowel. It then enters the thyroid cells from the bloodstream and gradually destroys them. Maximal benefit is usually noted within three to six months. It is not possible to eliminate "just the right amount" of the diseased thyroid gland, since radioiodine eventually damages all thyroid cells. Therefore, most endocrinologists strive to completely destroy the diseased thyroid gland with a single dose of radioiodine. This results in the intentional development of an underactive thyroid state (hypothyroidism), which is easily, predictably and inexpensively corrected by lifelong daily use of oral thyroid hormone replacement therapy. Although every effort  is made to calculate the correct dose of radioiodine for each patient, not every treatment will successfully correct the hyperthyroidism, particularly if the goiter is quite large and a second dose of radioactive iodine is occasionally needed. Thousands of patients have received radioiodine treatment, including former President of the [place] George H. W. Bush and his wife, Barbara. The treatment is a very safe, simple and reliably effective one. Because of this, it is considered by most thyroid specialists in the [place] to be the treatment of choice for hyperthyroidism cases caused by overproduction of thyroid hormone. . . Other Treatments A drug from the class of beta-adrenergic blocking agents (which decrease the effects of excess thyroid hormone) may be used temporarily to control hyperthyroid symptoms until other therapies take effect. In cases where hyperthyroidism is caused by thyroiditis or excessive ingestion of either iodine or thyroid hormone, this may be the only type of treatment required. Iodine drops are prescribed when hyperthyroidism is severe or prior to undergoing surgery for Graves' disease.   

## 2017-06-09 ENCOUNTER — Encounter: Payer: Self-pay | Admitting: Endocrinology

## 2017-06-09 LAB — THYROTROPIN RECEPTOR AUTOABS: THYROTROPIN RECEPTOR AB: 34.01 IU/L — AB (ref 0.00–1.75)

## 2017-06-09 MED ORDER — METHIMAZOLE 10 MG PO TABS: 10.0000 mg | ORAL_TABLET | Freq: Every day | ORAL | 2 refills | Status: DC

## 2017-06-09 NOTE — Addendum Note (Signed)
Addended by: Reather Littler on: 06/09/2017 09:08 AM   Modules accepted: Orders

## 2017-06-20 ENCOUNTER — Encounter: Payer: BC Managed Care – PPO | Admitting: Cardiology

## 2017-06-24 NOTE — Telephone Encounter (Signed)
Could the methimazole be causing the rash she has under her breasts, scalp, and thighs

## 2017-07-11 ENCOUNTER — Ambulatory Visit (INDEPENDENT_AMBULATORY_CARE_PROVIDER_SITE_OTHER): Payer: BC Managed Care – PPO

## 2017-07-11 ENCOUNTER — Ambulatory Visit (INDEPENDENT_AMBULATORY_CARE_PROVIDER_SITE_OTHER): Payer: BC Managed Care – PPO | Admitting: Orthopaedic Surgery

## 2017-07-11 ENCOUNTER — Encounter (INDEPENDENT_AMBULATORY_CARE_PROVIDER_SITE_OTHER): Payer: Self-pay | Admitting: Orthopaedic Surgery

## 2017-07-11 DIAGNOSIS — M25551 Pain in right hip: Secondary | ICD-10-CM

## 2017-07-11 MED ORDER — TIZANIDINE HCL 4 MG PO TABS: 4.0000 mg | ORAL_TABLET | Freq: Four times a day (QID) | ORAL | 2 refills | Status: DC | PRN

## 2017-07-11 MED ORDER — DICLOFENAC SODIUM 75 MG PO TBEC
75.0000 mg | DELAYED_RELEASE_TABLET | Freq: Two times a day (BID) | ORAL | 2 refills | Status: DC
Start: 2017-07-11 — End: 2017-12-07

## 2017-07-11 MED ORDER — PREDNISONE 10 MG (21) PO TBPK: ORAL_TABLET | ORAL | 0 refills | Status: DC

## 2017-07-11 NOTE — Progress Notes (Signed)
Office Visit Note   Patient: Anna Gutierrez           Date of Birth: October 21, 1950           MRN: [id number] Visit Date: 07/11/2017              Requested by: Ralene OkMoreira, Roy, MD [address] [place], [place] [id number] PCP: Ralene OkMoreira, Roy, MD   Assessment & Plan: Visit Diagnoses:  1. Pain in right hip     Plan: Impression is lumbar radiculopathy.  I refilled her prednisone taper.  Prescription for Voltaren and Zanaflex.  Referral to physical therapy.  Questions encouraged and answered.  Questions encouraged and answered.   Follow-Up Instructions: Return if symptoms worsen or fail to improve.   Orders:  Orders Placed This Encounter  Procedures  . XR HIP UNILAT W OR W/O PELVIS 2-3 VIEWS RIGHT   Meds ordered this encounter  Medications  . diclofenac (VOLTAREN) 75 MG EC tablet    Sig: Take 1 tablet (75 mg total) by mouth 2 (two) times daily.    Dispense:  30 tablet    Refill:  2  . tiZANidine (ZANAFLEX) 4 MG tablet    Sig: Take 1 tablet (4 mg total) by mouth every 6 (six) hours as needed for muscle spasms.    Dispense:  30 tablet    Refill:  2  . predniSONE (STERAPRED UNI-PAK 21 TAB) 10 MG (21) TBPK tablet    Sig: Take as directed    Dispense:  21 tablet    Refill:  0      Procedures: No procedures performed   Clinical Data: No additional findings.   Subjective: Chief Complaint  Patient presents with  . Right Hip - Pain    Patient is a 66 year old female who comes in with 2-week history of right buttock and leg pain that radiates into the knee.  She has seen her PCP who gave her a prednisone taper.  She also received a shot which did not help significantly.  She denies any groin pain.  Denies any injuries.  She denies history of diabetes.  Pain is worse with activity and better with rest.    Review of Systems  Constitutional: Negative.   HENT: Negative.   Eyes: Negative.   Respiratory: Negative.   Cardiovascular: Negative.   Endocrine: Negative.     Musculoskeletal: Negative.   Neurological: Negative.   Hematological: Negative.   Psychiatric/Behavioral: Negative.   All other systems reviewed and are negative.    Objective: Vital Signs: There were no vitals taken for this visit.  Physical Exam  Constitutional: She is oriented to person, place, and time. She appears well-developed and well-nourished.  HENT:  Head: Normocephalic and atraumatic.  Eyes: EOM are normal.  Neck: Neck supple.  Pulmonary/Chest: Effort normal.  Abdominal: Soft.  Neurological: She is alert and oriented to person, place, and time.  Skin: Skin is warm. Capillary refill takes less than 2 seconds.  Psychiatric: She has a normal mood and affect. Her behavior is normal. Judgment and thought content normal.  Nursing note and vitals reviewed.   Ortho Exam Right hip exam shows painless range of motion.  Negative Stinchfield sign.  No sciatic tension signs.  Lateral hip is nontender.  Pain localizes to the lower lumbar segments and buttock region. Specialty Comments:  No specialty comments available.  Imaging: Xr Hip Unilat W Or W/o Pelvis 2-3 Views Right  Result Date: 07/11/2017 No significant degenerative joint disease  PMFS History: Patient Active Problem List   Diagnosis Date Noted  . Sick sinus syndrome (HCC) 05/02/2017  . Symptomatic bradycardia 03/16/2017  . Pre-syncope 03/16/2017  . Hypertension 03/16/2017  . Fecal incontinence 05/22/2015  . Rectal prolapse 11/16/2014   Past Medical History:  Diagnosis Date  . Hypertension   . Symptomatic bradycardia 03/16/2017  . Thyroid goiter    had radioactive iodine for treatment    Family History  Problem Relation Age of Onset  . Renal Disease Mother   . Diabetes Mother   . COPD Father   . Hypertension Father   . Arthritis Sister   . Rheum arthritis Sister   . Diabetes Maternal Grandmother   . Glaucoma Sister   . Hypertension Sister   . Multiple sclerosis Sister   . Thyroid disease  Neg Hx     Past Surgical History:  Procedure Laterality Date  . CHOLECYSTECTOMY    . LEAD REVISION/REPAIR N/A 05/02/2017   Procedure: Lead Revision/Repair;  Surgeon: Regan Lemming, MD;  Location: [facility];  Service: Cardiovascular;  Laterality: N/A;  . PACEMAKER IMPLANT N/A 03/17/2017   Procedure: Pacemaker Implant;  Surgeon: Regan Lemming, MD;  Location: [facility];  Service: Cardiovascular;  Laterality: N/A;  . RECTAL PROLAPSE REPAIR  2016  . TONSILLECTOMY    . TUBAL LIGATION     Social History   Occupational History  . Not on file.   Social History Main Topics  . Smoking status: Never Smoker  . Smokeless tobacco: Never Used  . Alcohol use No  . Drug use: No  . Sexual activity: Not on file

## 2017-07-15 ENCOUNTER — Other Ambulatory Visit (INDEPENDENT_AMBULATORY_CARE_PROVIDER_SITE_OTHER): Payer: Medicare PPO

## 2017-07-15 DIAGNOSIS — E0590 Thyrotoxicosis, unspecified without thyrotoxic crisis or storm: Secondary | ICD-10-CM

## 2017-07-15 LAB — T4, FREE: Free T4: 0.94 ng/dL (ref 0.60–1.60)

## 2017-07-15 LAB — T3, FREE: T3, Free: 3.2 pg/mL (ref 2.3–4.2)

## 2017-07-19 NOTE — Progress Notes (Signed)
Patient ID: Anna Gutierrez, female   DOB: 08/17/1951, 66 y.o.   MRN: [id number]                                                                                                              Reason for Appointment:  Hyperthyroidism, follow-up  Referring physician: Ludwig ClarksMoreira   Chief complaint: Overactive thyroid   History of Present Illness:   The patient denies any symptoms of palpitations, shakiness, feeling excessively warm and sweaty, nervousness or weight loss  On her initial consultation she was complaining that for several months she does not think she has as much stamina and is not able to walk as much as she can because of fatigue She does not think she has any muscle weakness with going up stairs or getting up from squatting She has not had any change in her bowel habits or appetite Patient had a screening thyroid test done when she was admitted in July for symptomatic bradycardia and was found to be hyperthyroid She was discharged on 5 mg methimazole but she did not feel any different with this Her labs done by PCP in August showed a high normal total T4 and mildly increased T3 level of 212 with low TSH  She had not been on medications on her initial consultation in 9/18 Despite her lack of typical symptoms she had a significantly higher T3 level of 7.3 and free T4 for about 2.0 at baseline Thyrotropin receptor antibody was 34 She has been taking 10 mg of methimazole since 06/09/17  She  does not think she feels any of the above symptoms of hyperthyroidism; however she thinks she is a little less tired Thyroid levels are back to normal   Wt Readings from Last 3 Encounters:  07/20/17 159 lb 12.8 oz (72.5 kg)  06/08/17 158 lb 6.4 oz (71.8 kg)  05/03/17 158 lb (71.7 kg)      Thyroid function tests as follows:     Lab Results  Component Value Date   FREET4 0.94 07/15/2017   FREET4 1.97 (H) 06/08/2017   FREET4 1.81 (H) 03/16/2017   T3FREE 3.2 07/15/2017   T3FREE 7.3  (H) 06/08/2017   TSH <0.01 (L) 06/08/2017   TSH <0.010 (L) 03/16/2017    Lab Results  Component Value Date   THYROTRECAB 34.01 (H) 06/08/2017    BACKGROUND information:  At the age of 66 patient apparently had hyperthyroidism diagnosed when she had a prominent thyroid in the front of her neck.  She also thinks she may have had weight loss, palpitations, jitteriness at that time She was treated with radioactive iodine treatment and subsequently was started on thyroid was normal   Allergies as of 07/20/2017      Reactions   Lotrel [amlodipine Besy-benazepril Hcl] Swelling, Rash      Medication List        Accurate as of 07/20/17  8:39 AM. Always use your most recent med list.          acetaminophen 325 MG tablet Commonly known  as:  TYLENOL Take 1-2 tablets (325-650 mg total) by mouth every 4 (four) hours as needed for mild pain.   amLODipine 10 MG tablet Commonly known as:  NORVASC Take 10 mg by mouth daily.   cholecalciferol 1000 units tablet Commonly known as:  VITAMIN D Take 1,000 Units by mouth daily.   diclofenac 75 MG EC tablet Commonly known as:  VOLTAREN Take 1 tablet (75 mg total) by mouth 2 (two) times daily.   loperamide 2 MG capsule Commonly known as:  IMODIUM Take 6 mg by mouth 4 (four) times daily as needed for diarrhea or loose stools.   methimazole 10 MG tablet Commonly known as:  TAPAZOLE Take 1 tablet (10 mg total) by mouth daily.   predniSONE 10 MG (21) Tbpk tablet Commonly known as:  STERAPRED UNI-PAK 21 TAB Take as directed   tiZANidine 4 MG tablet Commonly known as:  ZANAFLEX Take 1 tablet (4 mg total) by mouth every 6 (six) hours as needed for muscle spasms.           Past Medical History:  Diagnosis Date  . Hypertension   . Symptomatic bradycardia 03/16/2017  . Thyroid goiter    had radioactive iodine for treatment    Past Surgical History:  Procedure Laterality Date  . CHOLECYSTECTOMY    . RECTAL PROLAPSE REPAIR  2016  .  TONSILLECTOMY    . TUBAL LIGATION      Family History  Problem Relation Age of Onset  . Renal Disease Mother   . Diabetes Mother   . COPD Father   . Hypertension Father   . Arthritis Sister   . Rheum arthritis Sister   . Diabetes Maternal Grandmother   . Glaucoma Sister   . Hypertension Sister   . Multiple sclerosis Sister   . Thyroid disease Neg Hx     Social History:  reports that  has never smoked. she has never used smokeless tobacco. She reports that she does not drink alcohol or use drugs.  Allergies:  Allergies  Allergen Reactions  . Lotrel [Amlodipine Besy-Benazepril Hcl] Swelling and Rash     Review of Systems   Has had some insomnia Recently had steroids for sciatica    Examination:   BP 122/88   Pulse 90   Ht 5\' 5"  (1.651 m)   Wt 159 lb 12.8 oz (72.5 kg)   SpO2 97%   BMI 26.59 kg/m    Right thyroid lobe was barely palpable Left thyroid lobe is just palpable also and about 1-1/2 times normal, slightly firm laterally  No tremor Biceps reflexes are normal No peripheral edema Skin appears normal   Assessment/Plan:   Hyperthyroidism, secondary from Graves' disease   She has had previous history of hyperthyroidism and has had a recurrence Surprisingly has been minimally symptomatic even with her frankly high thyroid levels at baseline She has a little less fatigue with starting into our drugs and also not as tachycardic  With treatment her thyroid enlargement appears to be smaller also  Since the thyroid levels are just normal she will continue the same dose of 10 mg daily and follow-up again in 6 weeks Discussed that if she does not get into remission in the next few months she may need to be as treated with I-131 again Discussed how I-131 works  For insomnia may be partly related to getting steroids recently  Total visit time 15 minutes  Reuel Lamadrid 07/20/2017, 8:39 AM    Note: This office note was  prepared with Academic librarian. Any transcriptional errors that result from this process are unintentional.

## 2017-07-20 ENCOUNTER — Ambulatory Visit (INDEPENDENT_AMBULATORY_CARE_PROVIDER_SITE_OTHER): Payer: Medicare PPO | Admitting: Endocrinology

## 2017-07-20 ENCOUNTER — Encounter: Payer: Self-pay | Admitting: Endocrinology

## 2017-07-20 VITALS — BP 122/88 | HR 90 | Ht 65.0 in | Wt 159.8 lb

## 2017-07-20 DIAGNOSIS — E0590 Thyrotoxicosis, unspecified without thyrotoxic crisis or storm: Secondary | ICD-10-CM | POA: Diagnosis not present

## 2017-08-02 ENCOUNTER — Encounter: Payer: Self-pay | Admitting: Cardiology

## 2017-08-02 ENCOUNTER — Ambulatory Visit (INDEPENDENT_AMBULATORY_CARE_PROVIDER_SITE_OTHER): Payer: Medicare PPO | Admitting: Cardiology

## 2017-08-02 VITALS — BP 118/86 | HR 91 | Ht 65.0 in | Wt 167.8 lb

## 2017-08-02 DIAGNOSIS — I495 Sick sinus syndrome: Secondary | ICD-10-CM | POA: Diagnosis not present

## 2017-08-02 DIAGNOSIS — I10 Essential (primary) hypertension: Secondary | ICD-10-CM

## 2017-08-02 LAB — CUP PACEART INCLINIC DEVICE CHECK
Battery Remaining Longevity: 181 mo
Battery Voltage: 3.19 V
Brady Statistic AS VS Percent: 98.48 %
Date Time Interrogation Session: 20181120115836
Implantable Lead Implant Date: 20180705
Implantable Lead Model: 5076
Implantable Pulse Generator Implant Date: 20180705
Lead Channel Impedance Value: 342 Ohm
Lead Channel Impedance Value: 475 Ohm
Lead Channel Pacing Threshold Amplitude: 0.625 V
Lead Channel Pacing Threshold Amplitude: 0.875 V
Lead Channel Pacing Threshold Pulse Width: 0.4 ms
Lead Channel Sensing Intrinsic Amplitude: 2.25 mV
Lead Channel Setting Sensing Sensitivity: 2.8 mV
MDC IDC LEAD IMPLANT DT: 20180705
MDC IDC LEAD LOCATION: 753859
MDC IDC LEAD LOCATION: 753860
MDC IDC MSMT LEADCHNL RA PACING THRESHOLD PULSEWIDTH: 0.4 ms
MDC IDC MSMT LEADCHNL RA SENSING INTR AMPL: 1.625 mV
MDC IDC MSMT LEADCHNL RV IMPEDANCE VALUE: 399 Ohm
MDC IDC MSMT LEADCHNL RV IMPEDANCE VALUE: 437 Ohm
MDC IDC MSMT LEADCHNL RV SENSING INTR AMPL: 8.875 mV
MDC IDC MSMT LEADCHNL RV SENSING INTR AMPL: 9 mV
MDC IDC SET LEADCHNL RA PACING AMPLITUDE: 2 V
MDC IDC SET LEADCHNL RV PACING AMPLITUDE: 2.5 V
MDC IDC SET LEADCHNL RV PACING PULSEWIDTH: 0.4 ms
MDC IDC STAT BRADY AP VP PERCENT: 0.14 %
MDC IDC STAT BRADY AP VS PERCENT: 1.35 %
MDC IDC STAT BRADY AS VP PERCENT: 0.03 %
MDC IDC STAT BRADY RA PERCENT PACED: 1.74 %
MDC IDC STAT BRADY RV PERCENT PACED: 0.17 %

## 2017-08-02 NOTE — Patient Instructions (Signed)
Medication Instructions:  Your physician recommends that you continue on your current medications as directed. Please refer to the Current Medication list given to you today.  *If you need a refill on your cardiac medications before your next appointment, please call your pharmacy*  Labwork: None ordered  Testing/Procedures: None ordered  Follow-Up: Remote monitoring is used to monitor your Pacemaker or ICD from home. This monitoring reduces the number of office visits required to check your device to one time per year. It allows us to keep an eye on the functioning of your device to ensure it is working properly. You are scheduled for a device check from home on 11/01/17. You may send your transmission at any time that day. If you have a wireless device, the transmission will be sent automatically. After your physician reviews your transmission, you will receive a postcard with your next transmission date.  Your physician wants you to follow-up in: 9 months with Dr. Elberta Fortisamnitz.  You will receive a reminder letter in the mail two months in advance. If you don't receive a letter, please call our office to schedule the follow-up appointment.  Thank you for choosing [facility]!!   Dory HornSherri Sharonica Kraszewski, RN [phone]

## 2017-08-02 NOTE — Progress Notes (Signed)
Electrophysiology Office Note   Date:  08/02/2017   ID:  Anna SchwabMichelle C [place], DOB Aug 04, 1951, MRN [id number]  PCP:  Ralene OkMoreira, Roy, MD  Cardiologist:  Tenny Crawoss Primary Electrophysiologist:  Channa Hazelett Jorja LoaMartin Florette Thai, MD    Chief Complaint  Patient presents with  . Pacemaker Check    Sick sinus syndrome     History of Present Illness: Anna Gutierrez is a 66 y.o. female who is being seen today for the evaluation of bradycardia, pacemaker at the request of Ralene OkMoreira, Roy, MD. Presenting today for electrophysiology evaluation. She has syncopal spell and was found to be bradycardic on arrival to the emergency room with heart rates in the 30s. She had symptom pauses of up to 4 seconds. She had a dual chamber pacemaker implanted on 03/17/17. She re-presented to the emergency room with pain over her pacemaker site. Her pacemaker had rotated, and leads had retracted on her x-ray.  Had a lead revision on 05/02/17.  Today, denies symptoms of palpitations, chest pain, shortness of breath, orthopnea, PND, lower extremity edema, claudication, dizziness, presyncope, syncope, bleeding, or neurologic sequela. The patient is tolerating medications without difficulties.  She has been doing well since her lead revision.  She still has some mild tenderness over her device site, but otherwise she is without complaint.   Past Medical History:  Diagnosis Date  . Hypertension   . Symptomatic bradycardia 03/16/2017  . Thyroid goiter    had radioactive iodine for treatment   Past Surgical History:  Procedure Laterality Date  . CHOLECYSTECTOMY    . Lead Revision/Repair N/A 05/02/2017   Performed by Regan Lemmingamnitz, Daritza Brees Martin[facility]  . Pacemaker Implant N/A 03/17/2017   Performed by Regan Lemmingamnitz, Samarra Ridgely Martin[facility]  . RECTAL PROLAPSE REPAIR  2016  . TONSILLECTOMY    . TUBAL LIGATION       Current Outpatient Medications  Medication Sig Dispense Refill  . acetaminophen (TYLENOL) 325 MG  tablet Take 1-2 tablets (325-650 mg total) by mouth every 4 (four) hours as needed for mild pain.    Marland Kitchen. amLODipine (NORVASC) 10 MG tablet Take 10 mg by mouth daily.  0  . cholecalciferol (VITAMIN D) 1000 units tablet Take 1,000 Units by mouth daily.    . diclofenac (VOLTAREN) 75 MG EC tablet Take 1 tablet (75 mg total) by mouth 2 (two) times daily. 30 tablet 2  . loperamide (IMODIUM) 2 MG capsule Take 6 mg by mouth 4 (four) times daily as needed for diarrhea or loose stools.    . methimazole (TAPAZOLE) 10 MG tablet Take 1 tablet (10 mg total) by mouth daily. 30 tablet 2  . tiZANidine (ZANAFLEX) 4 MG tablet Take 1 tablet (4 mg total) by mouth every 6 (six) hours as needed for muscle spasms. 30 tablet 2   No current facility-administered medications for this visit.     Allergies:   Lotrel [amlodipine besy-benazepril hcl]   Social History:  The patient  reports that  has never smoked. she has never used smokeless tobacco. She reports that she does not drink alcohol or use drugs.   Family History:  The patient's family history includes Arthritis in her sister; COPD in her father; Diabetes in her maternal grandmother and mother; Glaucoma in her sister; Hypertension in her father and sister; Multiple sclerosis in her sister; Renal Disease in her mother; Rheum arthritis in her sister.    ROS:  Please see the history of present illness.  Otherwise, review of systems is positive for none.   All other systems are reviewed and negative.   PHYSICAL EXAM: VS:  BP 118/86   Pulse 91   Ht 5\' 5"  (1.651 m)   Wt 167 lb 12.8 oz (76.1 kg)   BMI 27.92 kg/m  , BMI Body mass index is 27.92 kg/m. GEN: Well nourished, well developed, in no acute distress  HEENT: normal  Neck: no JVD, carotid bruits, or masses Cardiac: RRR; no murmurs, rubs, or gallops,no edema  Respiratory:  clear to auscultation bilaterally, normal work of breathing GI: soft, nontender, nondistended, + BS MS: no deformity or atrophy  Skin:  warm and dry, device site well healed Neuro:  Strength and sensation are intact Psych: euthymic mood, full affect  EKG:  EKG is ordered today. Personal review of the ekg ordered shows sinus rhythm, rate 91  Personal review of the device interrogation today. Results in [facility]    Recent Labs: 03/16/2017: Magnesium 2.2 04/21/2017: BUN 10; Creatinine, Ser 0.60; Hemoglobin 15.3; Platelets 276; Potassium 4.5; Sodium 144 06/08/2017: TSH <0.01    Lipid Panel  No results found for: CHOL, TRIG, HDL, CHOLHDL, VLDL, LDLCALC, LDLDIRECT   Wt Readings from Last 3 Encounters:  08/02/17 167 lb 12.8 oz (76.1 kg)  07/20/17 159 lb 12.8 oz (72.5 kg)  06/08/17 158 lb 6.4 oz (71.8 kg)      Other studies Reviewed: Additional studies/ records that were reviewed today include: CXR 04/04/17 - personally reviewed  Review of the above records today demonstrates:  1. Flipped orientation of the subcutaneous pacemaker generator component, although the transvenous leads appear stable in position.  TTE 03/17/17 - Left ventricle: The cavity size was normal. Wall thickness was   increased in a pattern of moderate LVH. Systolic function was   normal. The estimated ejection fraction was in the range of 60%   to 65%. Wall motion was normal; there were no regional wall   motion abnormalities. Doppler parameters are consistent with   abnormal left ventricular relaxation (grade 1 diastolic   dysfunction). - Left atrium: The atrium was mildly dilated. - Pulmonary arteries: Systolic pressure was moderately increased.   PA peak pressure: 57 mm Hg (S).  ASSESSMENT AND PLAN:  1.  Sick sinus syndrome: Had [employer]onic dual-chamber pacemaker implanted but had leads retracted.  Had lead revision on 05/02/17.  Device is functioning appropriately.  She has 15 years left on her battery.  No changes at this time.  2. Hypertension: Currently well controlled.  No changes.   Current medicines are reviewed at length with the patient  today.   The patient does not have concerns regarding her medicines.  The following changes were made today: None  Labs/ tests ordered today include:  Orders Placed This Encounter  Procedures  . EKG 12-Lead     Disposition:   FU with Atzel Mccambridge 9 months  Signed, Mayerli Kirst Jorja LoaMartin Nazia Rhines, MD  08/02/2017 11:41 AM     [facility] [address] [place] [address] [phone] (office) [phone] (fax)

## 2017-08-26 ENCOUNTER — Other Ambulatory Visit: Payer: Medicare PPO

## 2017-08-29 ENCOUNTER — Other Ambulatory Visit (INDEPENDENT_AMBULATORY_CARE_PROVIDER_SITE_OTHER): Payer: Medicare PPO

## 2017-08-29 DIAGNOSIS — E0590 Thyrotoxicosis, unspecified without thyrotoxic crisis or storm: Secondary | ICD-10-CM | POA: Diagnosis not present

## 2017-08-29 LAB — T3, FREE: T3 FREE: 3.6 pg/mL (ref 2.3–4.2)

## 2017-08-29 LAB — TSH: TSH: 0.32 u[IU]/mL — ABNORMAL LOW (ref 0.35–4.50)

## 2017-08-29 LAB — T4, FREE: Free T4: 0.65 ng/dL (ref 0.60–1.60)

## 2017-08-31 ENCOUNTER — Encounter: Payer: Self-pay | Admitting: Endocrinology

## 2017-08-31 ENCOUNTER — Ambulatory Visit (INDEPENDENT_AMBULATORY_CARE_PROVIDER_SITE_OTHER): Payer: Medicare PPO | Admitting: Endocrinology

## 2017-08-31 VITALS — BP 132/82 | HR 75 | Ht 65.0 in | Wt 166.0 lb

## 2017-08-31 DIAGNOSIS — E0590 Thyrotoxicosis, unspecified without thyrotoxic crisis or storm: Secondary | ICD-10-CM | POA: Diagnosis not present

## 2017-08-31 NOTE — Progress Notes (Signed)
Patient ID: Anna Gutierrez, female   DOB: [birth year], 66 y.o.   MRN: [id number]                                                                                                              Reason for Appointment:  Hyperthyroidism, follow-up  Referring physician: Ludwig ClarksMoreira   Chief complaint: Overactive thyroid   History of Present Illness:    On her initial consultation she was complaining that for several months she does not think she has as much stamina and is not able to walk as much as she can because of fatigue.  Did not have any symptoms of palpitations, shakiness, feeling excessively warm and sweaty, nervousness or weight loss She does not think she has any muscle weakness with going up stairs or getting up from squatting She has not had any change in her bowel habits or appetite Patient had a screening thyroid test done when she was admitted in July for symptomatic bradycardia and was found to be hyperthyroid She was discharged on 5 mg methimazole but she did not feel any different with this Her labs done by PCP in August showed a high normal total T4 and mildly increased T3 level of 212 with low TSH  She had not been on medications on her initial consultation in 9/18 Despite her lack of typical symptoms she had a significantly higher T3 level of 7.3 and free T4 for about 2.0 at baseline Thyrotropin receptor antibody was 34 She has been taking 10 mg of methimazole since 06/09/17  RECENT history:  She again does not complain of feeling fatigued since her last visit when her methimazole was continued unchanged at 10 mg Her weight is up slightly She does occasionally feel warm at night but no heat intolerance or cold intolerance during the day  She does appear to have a relatively lower free T4 but her T3 is still up are normal with slightly low TSH now   Wt Readings from Last 3 Encounters:  08/31/17 166 lb (75.3 kg)  08/02/17 167 lb 12.8 oz (76.1 kg)  07/20/17 159 lb 12.8 oz  (72.5 kg)      Thyroid function tests as follows:     Lab Results  Component Value Date   FREET4 0.65 08/29/2017   FREET4 0.94 07/15/2017   FREET4 1.97 (H) 06/08/2017   T3FREE 3.6 08/29/2017   T3FREE 3.2 07/15/2017   T3FREE 7.3 (H) 06/08/2017   TSH 0.32 (L) 08/29/2017   TSH <0.01 (L) 06/08/2017   TSH <0.010 (L) 03/16/2017    Lab Results  Component Value Date   THYROTRECAB 34.01 (H) 06/08/2017    BACKGROUND information:  At the age of 66 patient apparently had hyperthyroidism diagnosed when she had a prominent thyroid in the front of her neck.  She also thinks she may have had weight loss, palpitations, jitteriness at that time She was treated with radioactive iodine treatment and subsequently was started on thyroid was normal   Allergies as of 08/31/2017  Reactions   Lotrel [amlodipine Besy-benazepril Hcl] Swelling, Rash      Medication List        Accurate as of 08/31/17  9:46 AM. Always use your most recent med list.          acetaminophen 325 MG tablet Commonly known as:  TYLENOL Take 1-2 tablets (325-650 mg total) by mouth every 4 (four) hours as needed for mild pain.   amLODipine 10 MG tablet Commonly known as:  NORVASC Take 10 mg by mouth daily.   cholecalciferol 1000 units tablet Commonly known as:  VITAMIN D Take 1,000 Units by mouth daily.   diclofenac 75 MG EC tablet Commonly known as:  VOLTAREN Take 1 tablet (75 mg total) by mouth 2 (two) times daily.   loperamide 2 MG capsule Commonly known as:  IMODIUM Take 6 mg by mouth 4 (four) times daily as needed for diarrhea or loose stools.   methimazole 10 MG tablet Commonly known as:  TAPAZOLE Take 1 tablet (10 mg total) by mouth daily.   tiZANidine 4 MG tablet Commonly known as:  ZANAFLEX Take 1 tablet (4 mg total) by mouth every 6 (six) hours as needed for muscle spasms.           Past Medical History:  Diagnosis Date  . Hypertension   . Symptomatic bradycardia 03/16/2017  .  Thyroid goiter    had radioactive iodine for treatment    Past Surgical History:  Procedure Laterality Date  . CHOLECYSTECTOMY    . LEAD REVISION/REPAIR N/A 05/02/2017   Procedure: Lead Revision/Repair;  Surgeon: Regan Lemmingamnitz, Will Martin, MD;  Location: [facility];  Service: Cardiovascular;  Laterality: N/A;  . PACEMAKER IMPLANT N/A 03/17/2017   Procedure: Pacemaker Implant;  Surgeon: Regan Lemmingamnitz, Will Martin, MD;  Location: [facility];  Service: Cardiovascular;  Laterality: N/A;  . RECTAL PROLAPSE REPAIR  2016  . TONSILLECTOMY    . TUBAL LIGATION      Family History  Problem Relation Age of Onset  . Renal Disease Mother   . Diabetes Mother   . COPD Father   . Hypertension Father   . Arthritis Sister   . Rheum arthritis Sister   . Diabetes Maternal Grandmother   . Glaucoma Sister   . Hypertension Sister   . Multiple sclerosis Sister   . Thyroid disease Neg Hx     Social History:  reports that  has never smoked. she has never used smokeless tobacco. She reports that she does not drink alcohol or use drugs.  Allergies:  Allergies  Allergen Reactions  . Lotrel [Amlodipine Besy-Benazepril Hcl] Swelling and Rash     Review of Systems Has had some insomnia, no change    Examination:   BP 132/82   Pulse 75   Ht 5\' 5"  (1.651 m)   Wt 166 lb (75.3 kg)   SpO2 95%   BMI 27.62 kg/m    Right thyroid lobe is not palpable, left lobe barely palpable  Biceps reflexes are showing slightly slow relaxation  Skin appears normal   Assessment/Plan:   Hyperthyroidism, secondary from Graves' disease with recurrence this year  She has had minimal symptoms with her hyperthyroidism and currently feels fairly good Has had a little weight gain Her thyroid levels are well controlled with 10 mg methimazole Although her free T4 is low normal now her T3 level is still on the upper normal side TSH is not quite a suppressed Also her thyroid is not as significantly  large as  before  Discussed with the patient the nature of autoimmune thyroid disease, the fact that she has had a recurrence and a relatively high thyrotropin receptor antibody For now she will need to continue her methimazole, however with the changes in her labs she can try taking 10 mg alternating with 5 mg until her next visit  Total visit time 15 minutes  Patient Instructions  Take 1 pill alternating with 1/2 pill    Reather Littler 08/31/2017, 9:46 AM    Note: This office note was prepared with Dragon voice recognition system technology. Any transcriptional errors that result from this process are unintentional.

## 2017-08-31 NOTE — Patient Instructions (Signed)
Take 1 pill alternating with 1/2 pill

## 2017-09-05 ENCOUNTER — Other Ambulatory Visit: Payer: Self-pay | Admitting: Endocrinology

## 2017-09-12 ENCOUNTER — Other Ambulatory Visit: Payer: Self-pay | Admitting: Internal Medicine

## 2017-09-12 DIAGNOSIS — Z1231 Encounter for screening mammogram for malignant neoplasm of breast: Secondary | ICD-10-CM

## 2017-09-18 IMAGING — DX DG CHEST 2V
2 series · 2 of 2 positions shown · non-contrast
Comparison: Chest x-ray of November 05, 2016

CLINICAL DATA: Status post pacemaker lead replacement.

EXAM:
CHEST  2 VIEW

[chest pa]
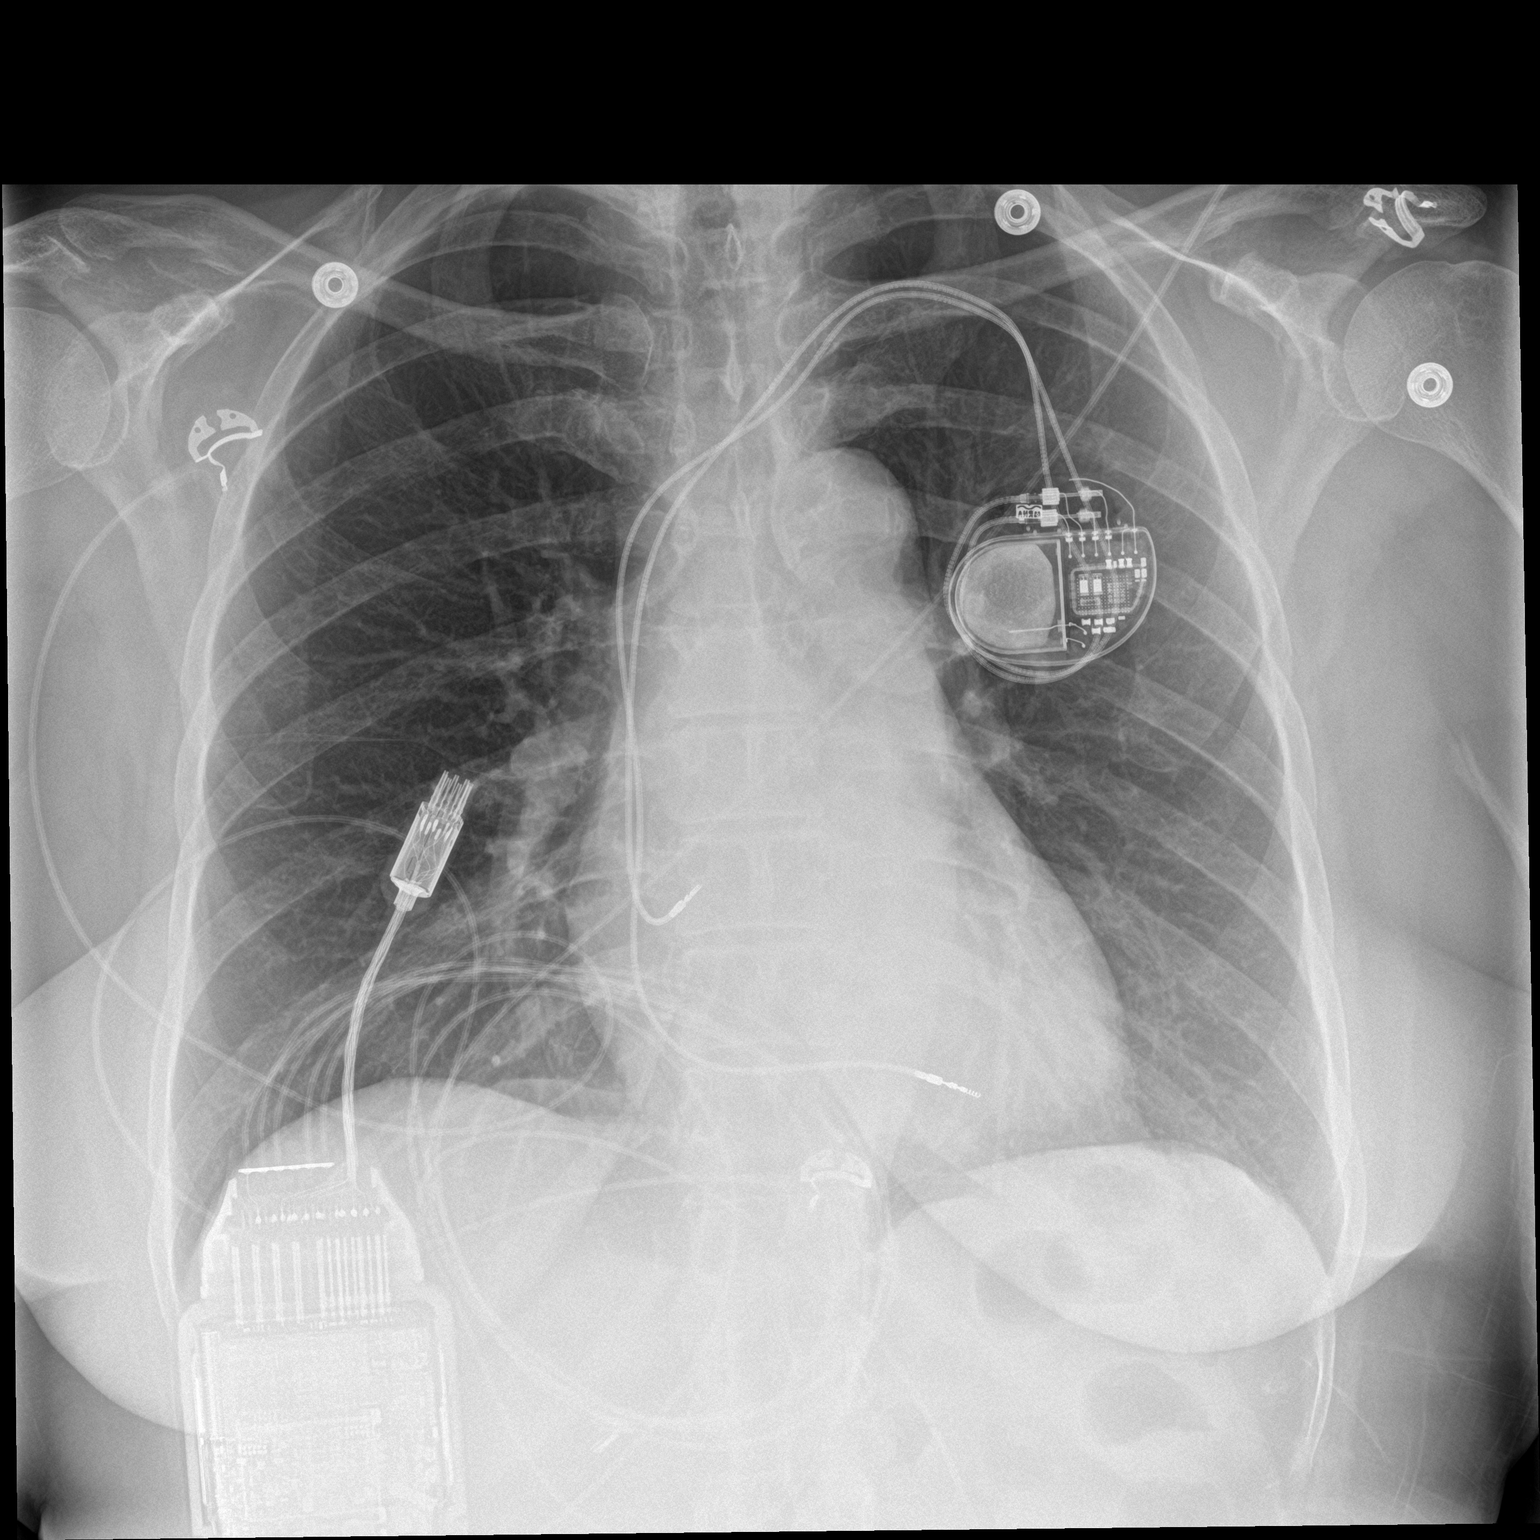

[chest lat]
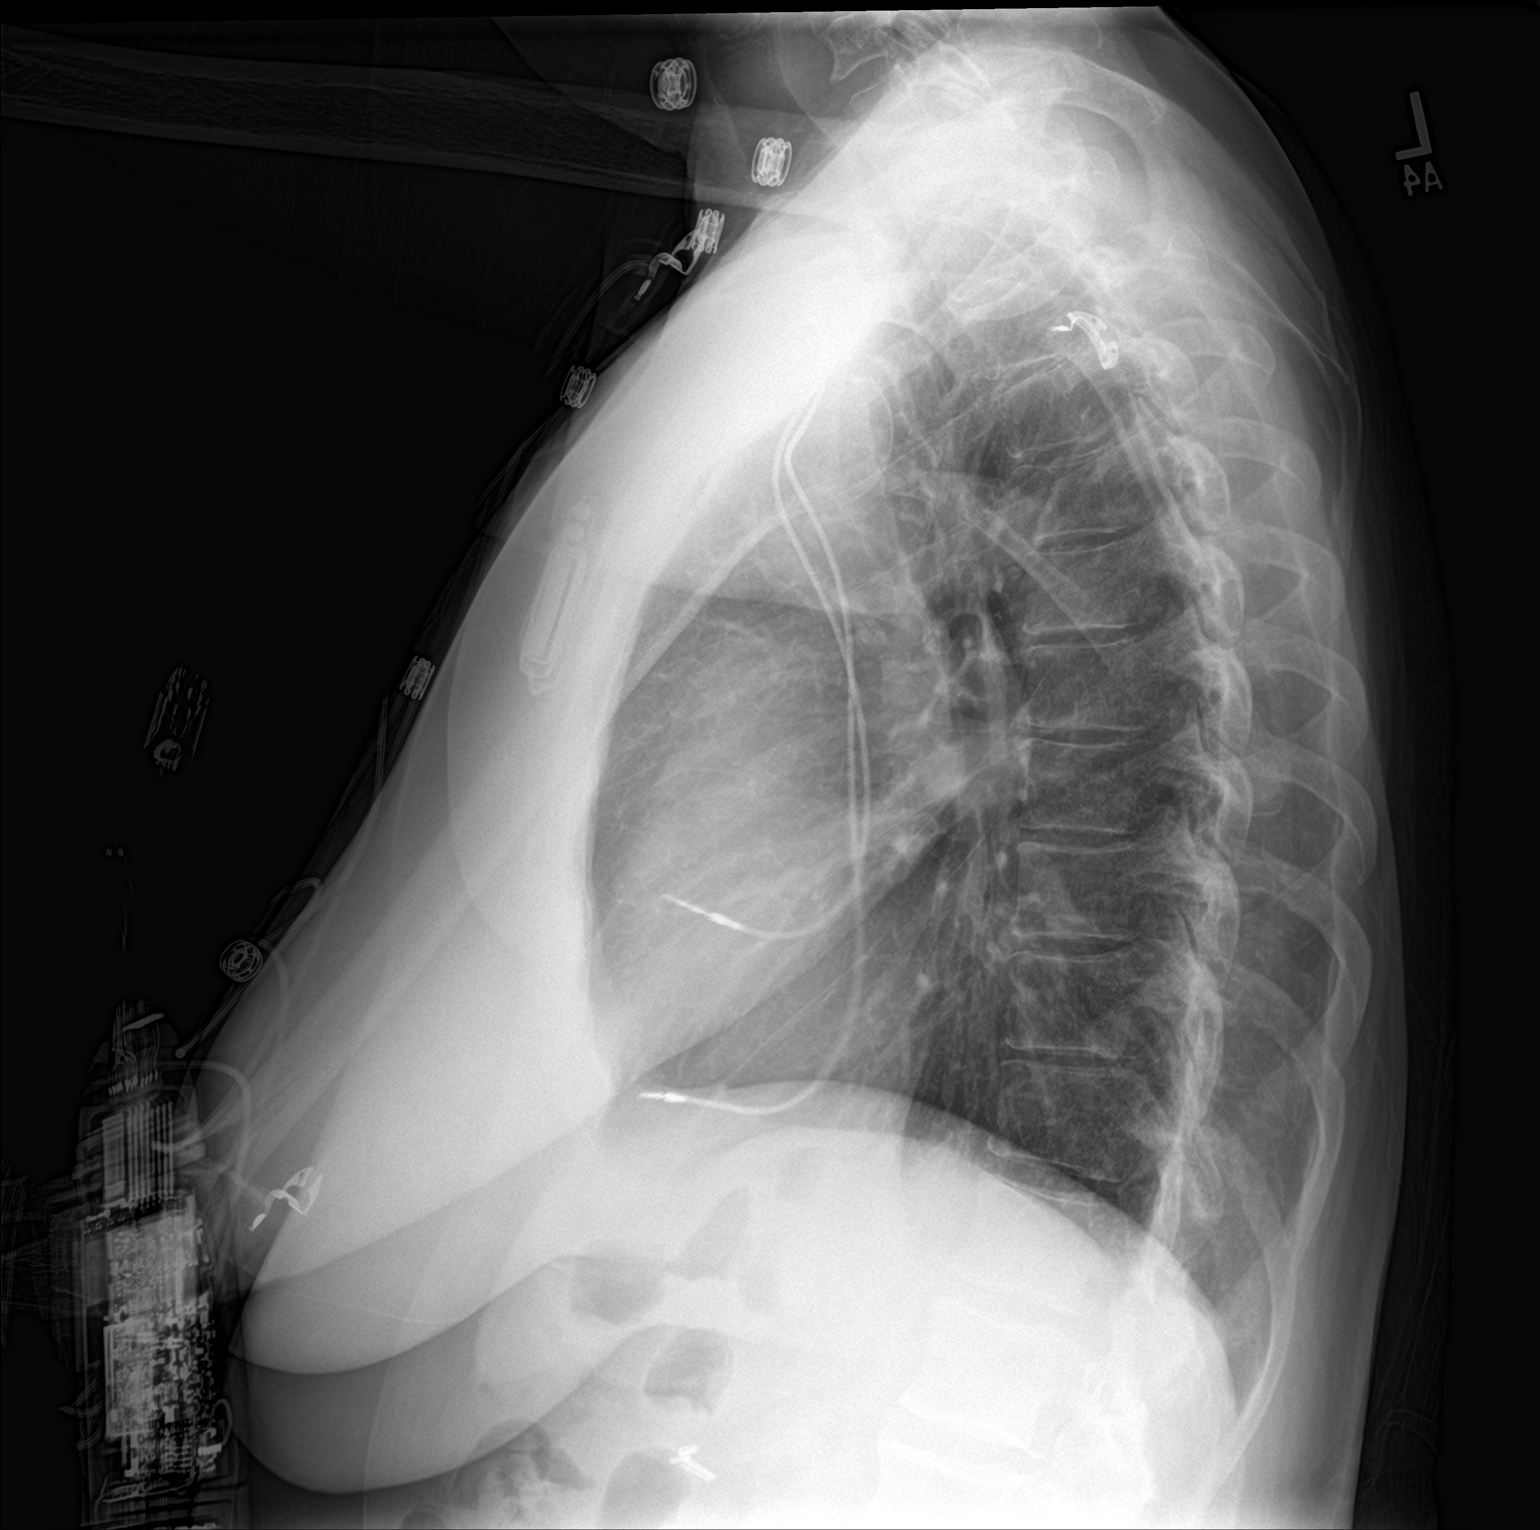

[2 of 2 positions shown; findings below may reference images not displayed]

FINDINGS: The permanent pacemaker generator appears to be in normal
orientation now over the left pectoral region. The atrial lead
projects to the approximate [DATE] to [DATE] position which is normal.
The ventricular lead also appears normal projecting over the right
ventricular apex. The heart and pulmonary vascularity are normal.
The lungs are clear. There is no pleural effusion or pneumothorax.
There is calcification in the wall of the aortic arch.
IMPRESSION: No postprocedure complication following pacemaker electrode
replacement.

## 2017-10-04 ENCOUNTER — Ambulatory Visit
Admission: RE | Admit: 2017-10-04 | Discharge: 2017-10-04 | Disposition: A | Discharge status: Home / Self Care | Payer: Medicare PPO | Source: Ambulatory Visit | Attending: Internal Medicine | Admitting: Internal Medicine

## 2017-10-04 DIAGNOSIS — Z1231 Encounter for screening mammogram for malignant neoplasm of breast: Secondary | ICD-10-CM

## 2017-10-10 ENCOUNTER — Other Ambulatory Visit (INDEPENDENT_AMBULATORY_CARE_PROVIDER_SITE_OTHER): Payer: Medicare PPO

## 2017-10-10 DIAGNOSIS — E0590 Thyrotoxicosis, unspecified without thyrotoxic crisis or storm: Secondary | ICD-10-CM

## 2017-10-10 LAB — T3, FREE: T3 FREE: 3.5 pg/mL (ref 2.3–4.2)

## 2017-10-10 LAB — T4, FREE: FREE T4: 0.72 ng/dL (ref 0.60–1.60)

## 2017-10-10 LAB — TSH: TSH: 1.38 u[IU]/mL (ref 0.35–4.50)

## 2017-10-11 NOTE — Progress Notes (Signed)
Patient ID: Anna SchwabMichelle C [place], female   DOB: [birth year], 67 y.o.   MRN: [id number]                                                                                                              Reason for Appointment:  Hyperthyroidism, follow-up  Referring physician: Ludwig ClarksMoreira   Chief complaint: Overactive thyroid   History of Present Illness:    On her initial consultation she was complaining that for several months she does not think she has as much stamina and is not able to walk as much as she can because of fatigue.  Did not have any symptoms of palpitations, shakiness, feeling excessively warm and sweaty, nervousness or weight loss She does not think she has any muscle weakness with going up stairs or getting up from squatting She has not had any change in her bowel habits or appetite Patient had a screening thyroid test done when she was admitted in July for symptomatic bradycardia and was found to be hyperthyroid She was discharged on 5 mg methimazole but she did not feel any different with this Her labs done by PCP in August showed a high normal total T4 and mildly increased T3 level of 212 with low TSH  She had not been on medications on her initial consultation in 9/18 Despite her lack of typical symptoms she had a significantly higher T3 level of 7.3 and free T4 for about 2.0 at baseline Thyrotropin receptor antibody was 34 She has been taking 10 mg of methimazole since 06/09/17  RECENT history:  Her dose of methimazole was changed in December and she is now taking 10 mg alternating with 5 mg This was when her free T4 was low normal at 0.65 At that time she was asymptomatic Her weight is up slightly and she is concerned about this  No complaints of fatigue She does appear to have a relatively lower free T4 but her T3 is still upper normal with slightly low TSH now   Wt Readings from Last 3 Encounters:  10/12/17 169 lb 6.4 oz (76.8 kg)  08/31/17 166 lb (75.3 kg)  08/02/17  167 lb 12.8 oz (76.1 kg)      Thyroid function tests as follows:     Lab Results  Component Value Date   FREET4 0.72 10/10/2017   FREET4 0.65 08/29/2017   FREET4 0.94 07/15/2017   T3FREE 3.5 10/10/2017   T3FREE 3.6 08/29/2017   T3FREE 3.2 07/15/2017   TSH 1.38 10/10/2017   TSH 0.32 (L) 08/29/2017   TSH <0.01 (L) 06/08/2017    Lab Results  Component Value Date   THYROTRECAB 34.01 (H) 06/08/2017    BACKGROUND information:  At the age of 67 patient apparently had hyperthyroidism diagnosed when she had a prominent thyroid in the front of her neck.  She also thinks she may have had weight loss, palpitations, jitteriness at that time  She was treated with radioactive iodine treatment and subsequently was started on thyroid was normal   Allergies as of 10/12/2017  Reactions   Lotrel [amlodipine Besy-benazepril Hcl] Swelling, Rash      Medication List        Accurate as of 10/12/17  9:51 AM. Always use your most recent med list.          acetaminophen 325 MG tablet Commonly known as:  TYLENOL Take 1-2 tablets (325-650 mg total) by mouth every 4 (four) hours as needed for mild pain.   amLODipine 10 MG tablet Commonly known as:  NORVASC Take 10 mg by mouth daily.   cholecalciferol 1000 units tablet Commonly known as:  VITAMIN D Take 1,000 Units by mouth daily.   diclofenac 75 MG EC tablet Commonly known as:  VOLTAREN Take 1 tablet (75 mg total) by mouth 2 (two) times daily.   loperamide 2 MG capsule Commonly known as:  IMODIUM Take 6 mg by mouth 4 (four) times daily as needed for diarrhea or loose stools.   methimazole 10 MG tablet Commonly known as:  TAPAZOLE take 1 tablet by mouth once daily   tiZANidine 4 MG tablet Commonly known as:  ZANAFLEX Take 1 tablet (4 mg total) by mouth every 6 (six) hours as needed for muscle spasms.           Past Medical History:  Diagnosis Date  . Hypertension   . Symptomatic bradycardia 03/16/2017  . Thyroid  goiter    had radioactive iodine for treatment    Past Surgical History:  Procedure Laterality Date  . CHOLECYSTECTOMY    . LEAD REVISION/REPAIR N/A 05/02/2017   Procedure: Lead Revision/Repair;  Surgeon: Regan Lemming, MD;  Location: [facility];  Service: Cardiovascular;  Laterality: N/A;  . PACEMAKER IMPLANT N/A 03/17/2017   Procedure: Pacemaker Implant;  Surgeon: Regan Lemming, MD;  Location: [facility];  Service: Cardiovascular;  Laterality: N/A;  . RECTAL PROLAPSE REPAIR  2016  . TONSILLECTOMY    . TUBAL LIGATION      Family History  Problem Relation Age of Onset  . Renal Disease Mother   . Diabetes Mother   . COPD Father   . Hypertension Father   . Arthritis Sister   . Rheum arthritis Sister   . Diabetes Maternal Grandmother   . Glaucoma Sister   . Hypertension Sister   . Multiple sclerosis Sister   . Thyroid disease Neg Hx     Social History:  reports that  has never smoked. she has never used smokeless tobacco. She reports that she does not drink alcohol or use drugs.  Allergies:  Allergies  Allergen Reactions  . Lotrel [Amlodipine Besy-Benazepril Hcl] Swelling and Rash     Review of Systems  Endocrine: Negative for cold intolerance.  Musculoskeletal: Positive for joint pain.       Hip pain        Examination:   BP 113/74 (BP Location: Left Arm, Patient Position: Sitting, Cuff Size: Normal)   Pulse 90   Temp 99.6 F (37.6 C) (Oral)   Ht 5\' 5"  (1.651 m)   Wt 169 lb 6.4 oz (76.8 kg)   SpO2 95%   BMI 28.19 kg/m    Thyroid not clearly palpable  Right biceps reflexes normal   Assessment/Plan:   Hyperthyroidism, secondary from Graves' disease with recurrence in 2018  She has had minimal symptoms with her hyperthyroidism and currently feels fairly good Has had a little weight gain Her thyroid levels are well controlled with 10 mg alternating with 5 mg methimazole  With the slight  reduction in her methimazole dose her  free T4 is not as low although TSH is slightly below normal now  For now she will need to continue her methimazole unchanged Consider reducing the dose further if her free T4 is low normal Will also recheck her thyrotropin receptor antibody in the next visit  Again discussed potential for I-131 treatment if need be and she is open to this  left hip pain: Discussed that this is unlikely to be related to her hyperthyroidism or medication and she did follow-up with PCP; likely to be from bursitis or radiating pain   Total visit time 15 minutes  There are no Patient Instructions on file for this visit.  Reather Littler 10/12/2017, 9:51 AM    Note: This office note was prepared with Dragon voice recognition system technology. Any transcriptional errors that result from this process are unintentional.

## 2017-10-12 ENCOUNTER — Ambulatory Visit (INDEPENDENT_AMBULATORY_CARE_PROVIDER_SITE_OTHER): Payer: Medicare PPO | Admitting: Endocrinology

## 2017-10-12 ENCOUNTER — Encounter: Payer: Self-pay | Admitting: Endocrinology

## 2017-10-12 VITALS — BP 113/74 | HR 90 | Temp 99.6°F | Ht 65.0 in | Wt 169.4 lb

## 2017-10-12 DIAGNOSIS — E0590 Thyrotoxicosis, unspecified without thyrotoxic crisis or storm: Secondary | ICD-10-CM | POA: Diagnosis not present

## 2017-11-01 ENCOUNTER — Ambulatory Visit (INDEPENDENT_AMBULATORY_CARE_PROVIDER_SITE_OTHER): Payer: Medicare PPO | Admitting: *Deleted

## 2017-11-01 ENCOUNTER — Telehealth: Payer: Self-pay | Admitting: Cardiology

## 2017-11-01 DIAGNOSIS — I495 Sick sinus syndrome: Secondary | ICD-10-CM

## 2017-11-01 NOTE — Telephone Encounter (Signed)
Spoke with pt and reminded pt of remote transmission that is due today. Pt verbalized understanding.   

## 2017-11-02 LAB — CUP PACEART REMOTE DEVICE CHECK
Battery Remaining Longevity: 178 mo
Battery Voltage: 3.16 V
Brady Statistic AS VP Percent: 0.03 %
Brady Statistic AS VS Percent: 98.51 %
Implantable Lead Implant Date: 20180705
Implantable Lead Location: 753860
Implantable Lead Model: 5076
Implantable Pulse Generator Implant Date: 20180705
Lead Channel Impedance Value: 342 Ohm
Lead Channel Impedance Value: 399 Ohm
Lead Channel Impedance Value: 475 Ohm
Lead Channel Pacing Threshold Amplitude: 0.625 V
Lead Channel Pacing Threshold Amplitude: 0.875 V
Lead Channel Pacing Threshold Pulse Width: 0.4 ms
Lead Channel Pacing Threshold Pulse Width: 0.4 ms
Lead Channel Sensing Intrinsic Amplitude: 3 mV
Lead Channel Sensing Intrinsic Amplitude: 8.875 mV
Lead Channel Setting Pacing Amplitude: 2 V
Lead Channel Setting Sensing Sensitivity: 2.8 mV
MDC IDC LEAD IMPLANT DT: 20180705
MDC IDC LEAD LOCATION: 753859
MDC IDC MSMT LEADCHNL RA SENSING INTR AMPL: 3 mV
MDC IDC MSMT LEADCHNL RV IMPEDANCE VALUE: 456 Ohm
MDC IDC MSMT LEADCHNL RV SENSING INTR AMPL: 8.875 mV
MDC IDC SESS DTM: 20190219201736
MDC IDC SET LEADCHNL RV PACING AMPLITUDE: 2.5 V
MDC IDC SET LEADCHNL RV PACING PULSEWIDTH: 0.4 ms
MDC IDC STAT BRADY AP VP PERCENT: 0.04 %
MDC IDC STAT BRADY AP VS PERCENT: 1.41 %
MDC IDC STAT BRADY RA PERCENT PACED: 1.6 %
MDC IDC STAT BRADY RV PERCENT PACED: 0.07 %

## 2017-11-02 NOTE — Progress Notes (Signed)
Remote pacemaker transmission.   

## 2017-11-03 ENCOUNTER — Encounter: Payer: Self-pay | Admitting: Cardiology

## 2017-12-05 ENCOUNTER — Other Ambulatory Visit (INDEPENDENT_AMBULATORY_CARE_PROVIDER_SITE_OTHER): Payer: Medicare PPO

## 2017-12-05 DIAGNOSIS — E0590 Thyrotoxicosis, unspecified without thyrotoxic crisis or storm: Secondary | ICD-10-CM | POA: Diagnosis not present

## 2017-12-05 LAB — CBC
HCT: 44.5 % (ref 36.0–46.0)
Hemoglobin: 15.2 g/dL — ABNORMAL HIGH (ref 12.0–15.0)
MCHC: 34.2 g/dL (ref 30.0–36.0)
MCV: 87.6 fl (ref 78.0–100.0)
PLATELETS: 274 10*3/uL (ref 150.0–400.0)
RBC: 5.08 Mil/uL (ref 3.87–5.11)
RDW: 14.8 % (ref 11.5–15.5)
WBC: 6.8 10*3/uL (ref 4.0–10.5)

## 2017-12-05 LAB — T4, FREE: Free T4: 0.59 ng/dL — ABNORMAL LOW (ref 0.60–1.60)

## 2017-12-05 LAB — T3, FREE: T3 FREE: 3.3 pg/mL (ref 2.3–4.2)

## 2017-12-05 LAB — ALT: ALT: 14 U/L (ref 0–35)

## 2017-12-05 LAB — TSH: TSH: 4.05 u[IU]/mL (ref 0.35–4.50)

## 2017-12-06 LAB — THYROTROPIN RECEPTOR AUTOABS: THYROTROPIN RECEPTOR AB: 17.03 IU/L — AB (ref 0.00–1.75)

## 2017-12-07 ENCOUNTER — Encounter: Payer: Self-pay | Admitting: Endocrinology

## 2017-12-07 ENCOUNTER — Ambulatory Visit (INDEPENDENT_AMBULATORY_CARE_PROVIDER_SITE_OTHER): Payer: Medicare PPO | Admitting: Endocrinology

## 2017-12-07 VITALS — BP 118/74 | HR 78 | Ht 65.0 in | Wt 169.0 lb

## 2017-12-07 DIAGNOSIS — E0590 Thyrotoxicosis, unspecified without thyrotoxic crisis or storm: Secondary | ICD-10-CM | POA: Diagnosis not present

## 2017-12-07 NOTE — Patient Instructions (Signed)
Take 1/2 pill daily on weekdays

## 2017-12-07 NOTE — Progress Notes (Signed)
Patient ID: Anna Gutierrez, female   DOB: [birth year], 67 y.o.   MRN: [id number]                                                                                                              Reason for Appointment:  Hyperthyroidism, follow-up  Referring physician: Ludwig Clarks   Chief complaint: Overactive thyroid   History of Present Illness:    On her initial consultation she was complaining that for several months she does not think she has as much stamina and is not able to walk as much as she can because of fatigue.  Did not have any symptoms of palpitations, shakiness, feeling excessively warm and sweaty, nervousness or weight loss She does not think she has any muscle weakness with going up stairs or getting up from squatting She has not had any change in her bowel habits or appetite Patient had a screening thyroid test done when she was admitted in July for symptomatic bradycardia and was found to be hyperthyroid She was discharged on 5 mg methimazole but she did not feel any different with this Her labs done by PCP in August showed a high normal total T4 and mildly increased T3 level of 212 with low TSH  She had not been on medications on her initial consultation in 9/18 Despite her lack of typical symptoms she had a significantly higher T3 level of 7.3 and free T4 for about 2.0 at baseline Thyrotropin receptor antibody was 34 She has been taking 10 mg of methimazole since 06/09/17  RECENT history:  Her dose of methimazole was changed in December 2018 and she is now continuing on 10 mg alternating with 5 mg  Currently her free T4 was not is low More recently she does think that she is feeling more sleepy for the last 2-3 weeks Her weight appears to be the same, she does not complain of any significant cold intolerance, hair or skin changes  She does now have a mildly decreased free T4 below normal and TSH is upper normal  Thyrotropin receptor antibody however is still not  normal  Wt Readings from Last 3 Encounters:  12/07/17 169 lb (76.7 kg)  10/12/17 169 lb 6.4 oz (76.8 kg)  08/31/17 166 lb (75.3 kg)      Thyroid function tests as follows:     Lab Results  Component Value Date   FREET4 0.59 (L) 12/05/2017   FREET4 0.72 10/10/2017   FREET4 0.65 08/29/2017   T3FREE 3.3 12/05/2017   T3FREE 3.5 10/10/2017   T3FREE 3.6 08/29/2017   TSH 4.05 12/05/2017   TSH 1.38 10/10/2017   TSH 0.32 (L) 08/29/2017    Lab Results  Component Value Date   THYROTRECAB 17.03 (H) 12/05/2017   THYROTRECAB 34.01 (H) 06/08/2017    BACKGROUND information:  At the age of 58 patient apparently had hyperthyroidism diagnosed when she had a prominent thyroid in the front of her neck.  She also thinks she may have had weight loss, palpitations, jitteriness at that time  She was treated with radioactive iodine treatment and subsequently was started on thyroid was normal   Allergies as of 12/07/2017      Reactions   Lotrel [amlodipine Besy-benazepril Hcl] Swelling, Rash      Medication List        Accurate as of 12/07/17  9:10 AM. Always use your most recent med list.          acetaminophen 325 MG tablet Commonly known as:  TYLENOL Take 1-2 tablets (325-650 mg total) by mouth every 4 (four) hours as needed for mild pain.   amLODipine 10 MG tablet Commonly known as:  NORVASC Take 10 mg by mouth daily.   cholecalciferol 1000 units tablet Commonly known as:  VITAMIN D Take 1,000 Units by mouth daily.   loperamide 2 MG capsule Commonly known as:  IMODIUM Take 6 mg by mouth 4 (four) times daily as needed for diarrhea or loose stools.   methimazole 10 MG tablet Commonly known as:  TAPAZOLE take 1 tablet by mouth once daily   tiZANidine 4 MG tablet Commonly known as:  ZANAFLEX Take 1 tablet (4 mg total) by mouth every 6 (six) hours as needed for muscle spasms.           Past Medical History:  Diagnosis Date  . Hypertension   . Symptomatic bradycardia  03/16/2017  . Thyroid goiter    had radioactive iodine for treatment    Past Surgical History:  Procedure Laterality Date  . CHOLECYSTECTOMY    . LEAD REVISION/REPAIR N/A 05/02/2017   Procedure: Lead Revision/Repair;  Surgeon: Regan Lemmingamnitz, Will Martin, MD;  Location: [facility];  Service: Cardiovascular;  Laterality: N/A;  . PACEMAKER IMPLANT N/A 03/17/2017   Procedure: Pacemaker Implant;  Surgeon: Regan Lemmingamnitz, Will Martin, MD;  Location: [facility];  Service: Cardiovascular;  Laterality: N/A;  . RECTAL PROLAPSE REPAIR  2016  . TONSILLECTOMY    . TUBAL LIGATION      Family History  Problem Relation Age of Onset  . Renal Disease Mother   . Diabetes Mother   . COPD Father   . Hypertension Father   . Arthritis Sister   . Rheum arthritis Sister   . Diabetes Maternal Grandmother   . Glaucoma Sister   . Hypertension Sister   . Multiple sclerosis Sister   . Thyroid disease Neg Hx     Social History:  reports that she has never smoked. She has never used smokeless tobacco. She reports that she does not drink alcohol or use drugs.  Allergies:  Allergies  Allergen Reactions  . Lotrel [Amlodipine Besy-Benazepril Hcl] Swelling and Rash     Review of Systems      Examination:   BP 118/74 (BP Location: Left Arm, Patient Position: Sitting, Cuff Size: Normal)   Pulse 78   Ht 5\' 5"  (1.651 m)   Wt 169 lb (76.7 kg)   SpO2 96%   BMI 28.12 kg/m    Thyroid not palpable  Right biceps reflexes normal   Assessment/Plan:   Hyperthyroidism, secondary from Graves' disease with recurrence in 2018  She has had minimal symptoms with her hyperthyroidism at baseline except fatigue  With continuing 7.5 mg of methimazole her thyroid levels now look like she is mildly hypothyroid with slightly low free T4 and upper normal TSH She is complaining of sleepiness and fatigue  Although her thyrotropin receptor antibody is still high she appears to be more sensitive to methimazole now  and may be able  to go into remission soon  Discussed current management, lab results and future treatment plans  He will now reduce her methimazole to 5 mg, 5 days a week   Follow-up in 6 weeks for reassessment and discuss further occasional adjustment, also discussed that if her thyroid levels are still low normal she may be able to stop methimazole  Total visit time 15 minutes  There are no Patient Instructions on file for this visit.  Reather Littler 12/07/2017, 9:10 AM    Note: This office note was prepared with Dragon voice recognition system technology. Any transcriptional errors that result from this process are unintentional.

## 2018-01-04 ENCOUNTER — Other Ambulatory Visit (INDEPENDENT_AMBULATORY_CARE_PROVIDER_SITE_OTHER): Payer: Medicare PPO

## 2018-01-04 DIAGNOSIS — E0590 Thyrotoxicosis, unspecified without thyrotoxic crisis or storm: Secondary | ICD-10-CM | POA: Diagnosis not present

## 2018-01-04 LAB — TSH: TSH: 1.39 u[IU]/mL (ref 0.35–4.50)

## 2018-01-04 LAB — T4, FREE: Free T4: 0.66 ng/dL (ref 0.60–1.60)

## 2018-01-06 ENCOUNTER — Ambulatory Visit (INDEPENDENT_AMBULATORY_CARE_PROVIDER_SITE_OTHER): Payer: Medicare PPO | Admitting: Endocrinology

## 2018-01-06 ENCOUNTER — Encounter: Payer: Self-pay | Admitting: Endocrinology

## 2018-01-06 VITALS — BP 110/72 | HR 78 | Ht 65.0 in | Wt 169.6 lb

## 2018-01-06 DIAGNOSIS — E0590 Thyrotoxicosis, unspecified without thyrotoxic crisis or storm: Secondary | ICD-10-CM | POA: Diagnosis not present

## 2018-01-06 NOTE — Progress Notes (Signed)
Patient ID: Anna Gutierrez, female   DOB: Sep 30, 1950, 67 y.o.   MRN: [id number]                                                                                                              Reason for Appointment:  Hyperthyroidism, follow-up  Referring physician: Ludwig ClarksMoreira   Chief complaint: Overactive thyroid   History of Present Illness:    On her initial consultation she was complaining that for several months she does not think she has as much stamina and is not able to walk as much as she can because of fatigue.  Did not have any symptoms of palpitations, shakiness, feeling excessively warm and sweaty, nervousness or weight loss She does not think she has any muscle weakness with going up stairs or getting up from squatting She has not had any change in her bowel habits or appetite Patient had a screening thyroid test done when she was admitted in July for symptomatic bradycardia and was found to be hyperthyroid She was discharged on 5 mg methimazole but she did not feel any different with this Her labs done by PCP in August showed a high normal total T4 and mildly increased T3 level of 212 with low TSH  She had not been on medications on her initial consultation in 9/18 Despite her lack of typical symptoms she had a significantly higher T3 level of 7.3 and free T4 for about 2.0 at baseline Thyrotropin receptor antibody was 34 She has been taking 10 mg of methimazole since 06/09/17  RECENT history:  Her dose of methimazole has been changed periodically since 12/18 gradually reduced  On her last visit in 3/19 she was taking 7.5 mg methimazole daily on an average With this she was starting to feel more tired and sleepy before the visit She also had a low free T4 of 0.59 This is despite her thyrotropin receptor antibody being still high  Currently her free T4 is not as low and still low normal however TSH is normal Her weight appears to be the same, she does not complain of any  significant recent fatigue, cold intolerance, hair or skin changes  She does now have a mildly decreased free T4 below normal and TSH is upper normal    Wt Readings from Last 3 Encounters:  01/06/18 169 lb 9.6 oz (76.9 kg)  12/07/17 169 lb (76.7 kg)  10/12/17 169 lb 6.4 oz (76.8 kg)      Thyroid function tests as follows:     Lab Results  Component Value Date   FREET4 0.66 01/04/2018   FREET4 0.59 (L) 12/05/2017   FREET4 0.72 10/10/2017   T3FREE 3.3 12/05/2017   T3FREE 3.5 10/10/2017   T3FREE 3.6 08/29/2017   TSH 1.39 01/04/2018   TSH 4.05 12/05/2017   TSH 1.38 10/10/2017    Lab Results  Component Value Date   THYROTRECAB 17.03 (H) 12/05/2017   THYROTRECAB 34.01 (H) 06/08/2017    BACKGROUND information:  At the age of 67 patient apparently had hyperthyroidism  diagnosed when she had a prominent thyroid in the front of her neck.  She also thinks she may have had weight loss, palpitations, jitteriness at that time  She was treated with radioactive iodine treatment and subsequently was started on thyroid was normal   Allergies as of 01/06/2018      Reactions   Lotrel [amlodipine Besy-benazepril Hcl] Swelling, Rash      Medication List        Accurate as of 01/06/18 11:14 AM. Always use your most recent med list.          acetaminophen 325 MG tablet Commonly known as:  TYLENOL Take 1-2 tablets (325-650 mg total) by mouth every 4 (four) hours as needed for mild pain.   amLODipine 10 MG tablet Commonly known as:  NORVASC Take 10 mg by mouth daily.   cholecalciferol 1000 units tablet Commonly known as:  VITAMIN D Take 1,000 Units by mouth daily.   loperamide 2 MG capsule Commonly known as:  IMODIUM Take 6 mg by mouth 4 (four) times daily as needed for diarrhea or loose stools.   methimazole 10 MG tablet Commonly known as:  TAPAZOLE take 1 tablet by mouth once daily   tiZANidine 4 MG tablet Commonly known as:  ZANAFLEX Take 1 tablet (4 mg total) by  mouth every 6 (six) hours as needed for muscle spasms.           Past Medical History:  Diagnosis Date  . Hypertension   . Symptomatic bradycardia 03/16/2017  . Thyroid goiter    had radioactive iodine for treatment    Past Surgical History:  Procedure Laterality Date  . CHOLECYSTECTOMY    . LEAD REVISION/REPAIR N/A 05/02/2017   Procedure: Lead Revision/Repair;  Surgeon: Regan Lemming, MD;  Location: [facility];  Service: Cardiovascular;  Laterality: N/A;  . PACEMAKER IMPLANT N/A 03/17/2017   Procedure: Pacemaker Implant;  Surgeon: Regan Lemming, MD;  Location: [facility];  Service: Cardiovascular;  Laterality: N/A;  . RECTAL PROLAPSE REPAIR  2016  . TONSILLECTOMY    . TUBAL LIGATION      Family History  Problem Relation Age of Onset  . Renal Disease Mother   . Diabetes Mother   . COPD Father   . Hypertension Father   . Arthritis Sister   . Rheum arthritis Sister   . Diabetes Maternal Grandmother   . Glaucoma Sister   . Hypertension Sister   . Multiple sclerosis Sister   . Thyroid disease Neg Hx     Social History:  reports that she has never smoked. She has never used smokeless tobacco. She reports that she does not drink alcohol or use drugs.  Allergies:  Allergies  Allergen Reactions  . Lotrel [Amlodipine Besy-Benazepril Hcl] Swelling and Rash     Review of Systems      Examination:   BP 110/72 (BP Location: Left Arm, Patient Position: Sitting, Cuff Size: Normal)   Pulse 78   Ht 5\' 5"  (1.651 m)   Wt 169 lb 9.6 oz (76.9 kg)   SpO2 96%   BMI 28.22 kg/m    Thyroid not palpable No tremor No eye signs  Right biceps reflexes normal   Assessment/Plan:   Hyperthyroidism, secondary from Graves' disease with recurrence in 2018 She has had minimal symptoms with her hyperthyroidism at baseline except fatigue  With decreasing her methimazole progressively over the last 4 to 5 months her thyroid levels are continuing to be low  normal She is doing a little better subjectively now  Although her thyrotropin receptor antibody was high in March she is requiring very little medication Only taking methimazole 5 mg, 5 days a week  She may be in remission and will try to stop her methimazole and follow-up in 6 weeks   Discussed that she will need to call us if she has any unusual fatigue since this is the only symptom she had at baseline  Continue follow-up with PCP for attention which appears well-controlled  There are no Patient Instructions on file for this visit.  Reather Littler 01/06/2018, 11:14 AM    Note: This office note was prepared with Dragon voice recognition system technology. Any transcriptional errors that result from this process are unintentional.

## 2018-01-31 ENCOUNTER — Ambulatory Visit (INDEPENDENT_AMBULATORY_CARE_PROVIDER_SITE_OTHER): Payer: Medicare PPO | Admitting: *Deleted

## 2018-01-31 DIAGNOSIS — I495 Sick sinus syndrome: Secondary | ICD-10-CM

## 2018-01-31 DIAGNOSIS — R001 Bradycardia, unspecified: Secondary | ICD-10-CM

## 2018-01-31 NOTE — Progress Notes (Signed)
Remote pacemaker transmission.   

## 2018-02-10 LAB — CUP PACEART REMOTE DEVICE CHECK
Battery Voltage: 3.13 V
Brady Statistic AS VS Percent: 98.56 %
Brady Statistic RA Percent Paced: 1.51 %
Implantable Lead Implant Date: 20180705
Implantable Lead Implant Date: 20180705
Implantable Lead Location: 753860
Implantable Lead Model: 5076
Implantable Pulse Generator Implant Date: 20180705
Lead Channel Impedance Value: 342 Ohm
Lead Channel Pacing Threshold Amplitude: 0.625 V
Lead Channel Pacing Threshold Pulse Width: 0.4 ms
Lead Channel Pacing Threshold Pulse Width: 0.4 ms
Lead Channel Sensing Intrinsic Amplitude: 1.75 mV
Lead Channel Sensing Intrinsic Amplitude: 1.75 mV
Lead Channel Setting Pacing Amplitude: 2 V
MDC IDC LEAD LOCATION: 753859
MDC IDC MSMT BATTERY REMAINING LONGEVITY: 175 mo
MDC IDC MSMT LEADCHNL RA IMPEDANCE VALUE: 513 Ohm
MDC IDC MSMT LEADCHNL RV IMPEDANCE VALUE: 399 Ohm
MDC IDC MSMT LEADCHNL RV IMPEDANCE VALUE: 437 Ohm
MDC IDC MSMT LEADCHNL RV PACING THRESHOLD AMPLITUDE: 1 V
MDC IDC MSMT LEADCHNL RV SENSING INTR AMPL: 7.625 mV
MDC IDC MSMT LEADCHNL RV SENSING INTR AMPL: 7.625 mV
MDC IDC SESS DTM: 20190521045718
MDC IDC SET LEADCHNL RV PACING AMPLITUDE: 2.5 V
MDC IDC SET LEADCHNL RV PACING PULSEWIDTH: 0.4 ms
MDC IDC SET LEADCHNL RV SENSING SENSITIVITY: 2.8 mV
MDC IDC STAT BRADY AP VP PERCENT: 0.02 %
MDC IDC STAT BRADY AP VS PERCENT: 1.39 %
MDC IDC STAT BRADY AS VP PERCENT: 0.03 %
MDC IDC STAT BRADY RV PERCENT PACED: 0.05 %

## 2018-02-17 ENCOUNTER — Other Ambulatory Visit (INDEPENDENT_AMBULATORY_CARE_PROVIDER_SITE_OTHER): Payer: Medicare PPO

## 2018-02-17 DIAGNOSIS — E0590 Thyrotoxicosis, unspecified without thyrotoxic crisis or storm: Secondary | ICD-10-CM

## 2018-02-17 LAB — T4, FREE: FREE T4: 1.22 ng/dL (ref 0.60–1.60)

## 2018-02-17 LAB — TSH

## 2018-02-20 ENCOUNTER — Ambulatory Visit (INDEPENDENT_AMBULATORY_CARE_PROVIDER_SITE_OTHER): Payer: Medicare PPO | Admitting: Endocrinology

## 2018-02-20 ENCOUNTER — Encounter: Payer: Self-pay | Admitting: Endocrinology

## 2018-02-20 VITALS — BP 130/80 | HR 89 | Ht 65.0 in | Wt 169.0 lb

## 2018-02-20 DIAGNOSIS — E0590 Thyrotoxicosis, unspecified without thyrotoxic crisis or storm: Secondary | ICD-10-CM | POA: Diagnosis not present

## 2018-02-20 NOTE — Patient Instructions (Signed)
Take 1/2 pill on even days of month

## 2018-02-20 NOTE — Progress Notes (Signed)
Patient ID: Anna Gutierrez, female   DOB: 10-28-1950, 67 y.o.   MRN: [id number]                                                                                                              Reason for Appointment:  Hyperthyroidism, follow-up  Referring physician: Ludwig Clarks   Chief complaint: Overactive thyroid   History of Present Illness:    On her initial consultation she was complaining that for several months she does not think she has as much stamina and is not able to walk as much as she can because of fatigue.  Did not have any symptoms of palpitations, shakiness, feeling excessively warm and sweaty, nervousness or weight loss She does not think she has any muscle weakness with going up stairs or getting up from squatting She has not had any change in her bowel habits or appetite Patient had a screening thyroid test done when she was admitted in July for symptomatic bradycardia and was found to be hyperthyroid She was discharged on 5 mg methimazole but she did not feel any different with this Her labs done by PCP in August showed a high normal total T4 and mildly increased T3 level of 212 with low TSH  She had not been on medications on her initial consultation in 9/18 Despite her lack of typical symptoms she had a significantly higher T3 level of 7.3 and free T4 for about 2.0 at baseline Thyrotropin receptor antibody was 34 She has been taking 10 mg of methimazole since 06/09/17  RECENT history:  Her dose of methimazole has been changed periodically since 12/18  On her last visit in 12/2017 she was taking 5 mg of methimazole only 5 days a week Previously her thyroid levels were low normal with some symptoms of feeling tired and sleepy also  More recently she has felt fairly good with normal energy level Her weight is stable She has no weakness, palpitations or heat intolerance  Although she has been off her methimazole for about 6 weeks now her TSH is now undetectable and  free T4 higher than before    Wt Readings from Last 3 Encounters:  02/20/18 169 lb (76.7 kg)  01/06/18 169 lb 9.6 oz (76.9 kg)  12/07/17 169 lb (76.7 kg)      Thyroid function tests as follows:     Lab Results  Component Value Date   FREET4 1.22 02/17/2018   FREET4 0.66 01/04/2018   FREET4 0.59 (L) 12/05/2017   T3FREE 3.3 12/05/2017   T3FREE 3.5 10/10/2017   T3FREE 3.6 08/29/2017   TSH <0.01 (L) 02/17/2018   TSH 1.39 01/04/2018   TSH 4.05 12/05/2017    Lab Results  Component Value Date   THYROTRECAB 17.03 (H) 12/05/2017   THYROTRECAB 34.01 (H) 06/08/2017    BACKGROUND information:  At the age of 66 patient apparently had hyperthyroidism diagnosed when she had a prominent thyroid in the front of her neck.  She also thinks she may have had weight loss, palpitations, jitteriness at  that time  She was treated with radioactive iodine treatment and subsequently was started on thyroid was normal   Allergies as of 02/20/2018      Reactions   Lotrel [amlodipine Besy-benazepril Hcl] Swelling, Rash      Medication List        Accurate as of 02/20/18  9:12 PM. Always use your most recent med list.          acetaminophen 325 MG tablet Commonly known as:  TYLENOL Take 1-2 tablets (325-650 mg total) by mouth every 4 (four) hours as needed for mild pain.   amLODipine 10 MG tablet Commonly known as:  NORVASC Take 10 mg by mouth daily.   cholecalciferol 1000 units tablet Commonly known as:  VITAMIN D Take 1,000 Units by mouth daily.   loperamide 2 MG capsule Commonly known as:  IMODIUM Take 6 mg by mouth 4 (four) times daily as needed for diarrhea or loose stools.           Past Medical History:  Diagnosis Date  . Hypertension   . Symptomatic bradycardia 03/16/2017  . Thyroid goiter    had radioactive iodine for treatment    Past Surgical History:  Procedure Laterality Date  . CHOLECYSTECTOMY    . LEAD REVISION/REPAIR N/A 05/02/2017   Procedure: Lead  Revision/Repair;  Surgeon: Regan Lemming, MD;  Location: [facility];  Service: Cardiovascular;  Laterality: N/A;  . PACEMAKER IMPLANT N/A 03/17/2017   Procedure: Pacemaker Implant;  Surgeon: Regan Lemming, MD;  Location: [facility];  Service: Cardiovascular;  Laterality: N/A;  . RECTAL PROLAPSE REPAIR  2016  . TONSILLECTOMY    . TUBAL LIGATION      Family History  Problem Relation Age of Onset  . Renal Disease Mother   . Diabetes Mother   . COPD Father   . Hypertension Father   . Arthritis Sister   . Rheum arthritis Sister   . Diabetes Maternal Grandmother   . Glaucoma Sister   . Hypertension Sister   . Multiple sclerosis Sister   . Thyroid disease Neg Hx     Social History:  reports that she has never smoked. She has never used smokeless tobacco. She reports that she does not drink alcohol or use drugs.  Allergies:  Allergies  Allergen Reactions  . Lotrel [Amlodipine Besy-Benazepril Hcl] Swelling and Rash     Review of Systems     Examination:   BP 130/80 (BP Location: Left Arm, Patient Position: Sitting, Cuff Size: Normal)   Pulse 89   Ht 5\' 5"  (1.651 m)   Wt 169 lb (76.7 kg)   SpO2 95%   BMI 28.12 kg/m   Thyroid not palpable No tremor  Right biceps reflexes slightly brisk Hands not unusually warm or diaphoretic   Assessment/Plan:   Hyperthyroidism, secondary from Graves' disease with recurrence in 2018 She has had minimal symptoms with her hyperthyroidism at baseline except fatigue No goiter on exam  Although her methimazole has been progressively tapered off this year and her free T4 had been at the lower end of normal with normal TSH now without her methimazole TSH is undetectable with relatively higher free T4 in the normal range  This indicates that she is not in remission as yet  She will now restart low-dose methimazole and for convenience can take 5 mg every other day  Follow-up in 8 weeks, to call if she has any  unusual symptoms   Patient Instructions  Take 1/2 pill on even days of month   Anna Gutierrez 02/20/2018, 9:12 PM    Note: This office note was prepared with Dragon voice recognition system technology. Any transcriptional errors that result from this process are unintentional.

## 2018-04-14 ENCOUNTER — Other Ambulatory Visit (INDEPENDENT_AMBULATORY_CARE_PROVIDER_SITE_OTHER): Payer: Medicare PPO

## 2018-04-14 DIAGNOSIS — E0590 Thyrotoxicosis, unspecified without thyrotoxic crisis or storm: Secondary | ICD-10-CM

## 2018-04-14 LAB — TSH: TSH: <0.01 u[IU]/mL — ABNORMAL LOW (ref 0.35–4.50)

## 2018-04-14 LAB — T3, FREE: T3 FREE: 4.9 pg/mL — AB (ref 2.3–4.2)

## 2018-04-14 LAB — T4, FREE: Free T4: 1.19 ng/dL (ref 0.60–1.60)

## 2018-04-15 LAB — THYROTROPIN RECEPTOR AUTOABS: THYROTROPIN RECEPTOR AB: 17 IU/L — AB (ref 0.00–1.75)

## 2018-04-17 ENCOUNTER — Ambulatory Visit (INDEPENDENT_AMBULATORY_CARE_PROVIDER_SITE_OTHER): Payer: Medicare PPO | Admitting: Endocrinology

## 2018-04-17 ENCOUNTER — Encounter: Payer: Self-pay | Admitting: Endocrinology

## 2018-04-17 VITALS — BP 132/86 | HR 86 | Ht 65.0 in | Wt 167.8 lb

## 2018-04-17 DIAGNOSIS — E0590 Thyrotoxicosis, unspecified without thyrotoxic crisis or storm: Secondary | ICD-10-CM

## 2018-04-17 MED ORDER — METHIMAZOLE 5 MG PO TABS: 5.0000 mg | ORAL_TABLET | Freq: Every day | ORAL | 3 refills | Status: DC

## 2018-04-17 NOTE — Progress Notes (Signed)
Patient ID: Anna Gutierrez, female   DOB: 06-10-1951, 67 y.o.   MRN: [id number]                                                                                                              Reason for Appointment:  Hyperthyroidism, follow-up  Referring physician: Ludwig Clarks   Chief complaint: Overactive thyroid   History of Present Illness:    On her initial consultation she was complaining that for several months she does not think she has as much stamina and is not able to walk as much as she can because of fatigue.  Did not have any symptoms of palpitations, shakiness, feeling excessively warm and sweaty, nervousness or weight loss She does not think she has any muscle weakness with going up stairs or getting up from squatting She has not had any change in her bowel habits or appetite Patient had a screening thyroid test done when she was admitted in July for symptomatic bradycardia and was found to be hyperthyroid She was discharged on 5 mg methimazole but she did not feel any different with this Her labs done by PCP in August showed a high normal total T4 and mildly increased T3 level of 212 with low TSH  She had not been on medications on her initial consultation in 9/18 Despite her lack of typical symptoms she had a significantly higher T3 level of 7.3 and free T4 for about 2.0 at baseline Thyrotropin receptor antibody was 34 She has been taking 10 mg of methimazole since 06/09/17  RECENT history:  Her dose of methimazole has been changed periodically since 12/18  On her last visit in 02/2018 she was off methimazole, prior to that in 12/2017 her TSH was normal and free T4 low normal with methimazole  However subsequently her TSH has gone down with relatively higher free T4 level She was not having any symptoms of hyperthyroidism in June  She now has been taking 5 mg of methimazole only every other day  Currently she has felt fairly good with normal energy level and no weakness,  shakiness or palpitations Her weight has gone down 2 pounds  Labs show her TSH to be still undetectable but her free T3 is above normal Thyrotropin receptor antibody still high at 17 as of 8/19   Wt Readings from Last 3 Encounters:  04/17/18 167 lb 12.8 oz (76.1 kg)  02/20/18 169 lb (76.7 kg)  01/06/18 169 lb 9.6 oz (76.9 kg)      Thyroid function tests as follows:     Lab Results  Component Value Date   FREET4 1.19 04/14/2018   FREET4 1.22 02/17/2018   FREET4 0.66 01/04/2018   T3FREE 4.9 (H) 04/14/2018   T3FREE 3.3 12/05/2017   T3FREE 3.5 10/10/2017   TSH <0.01 (L) 04/14/2018   TSH <0.01 (L) 02/17/2018   TSH 1.39 01/04/2018    Lab Results  Component Value Date   THYROTRECAB 17.00 (H) 04/14/2018   THYROTRECAB 17.03 (H) 12/05/2017   THYROTRECAB 34.01 (H) 06/08/2017  BACKGROUND information:  At the age of 67 patient apparently had hyperthyroidism diagnosed when she had a prominent thyroid in the front of her neck.  She also thinks she may have had weight loss, palpitations, jitteriness at that time  She was treated with radioactive iodine treatment and subsequently was started on thyroid was normal   Allergies as of 04/17/2018      Reactions   Lotrel [amlodipine Besy-benazepril Hcl] Swelling, Rash      Medication List        Accurate as of 04/17/18  9:45 AM. Always use your most recent med list.          acetaminophen 325 MG tablet Commonly known as:  TYLENOL Take 1-2 tablets (325-650 mg total) by mouth every 4 (four) hours as needed for mild pain.   amLODipine 10 MG tablet Commonly known as:  NORVASC Take 10 mg by mouth daily.   cholecalciferol 1000 units tablet Commonly known as:  VITAMIN D Take 1,000 Units by mouth daily.   loperamide 2 MG capsule Commonly known as:  IMODIUM Take 6 mg by mouth 4 (four) times daily as needed for diarrhea or loose stools.   methimazole 5 MG tablet Commonly known as:  TAPAZOLE Take 1 tablet (5 mg total) by mouth  daily.           Past Medical History:  Diagnosis Date  . Hypertension   . Symptomatic bradycardia 03/16/2017  . Thyroid goiter    had radioactive iodine for treatment    Past Surgical History:  Procedure Laterality Date  . CHOLECYSTECTOMY    . LEAD REVISION/REPAIR N/A 05/02/2017   Procedure: Lead Revision/Repair;  Surgeon: Regan Lemmingamnitz, Will Martin, MD;  Location: [facility];  Service: Cardiovascular;  Laterality: N/A;  . PACEMAKER IMPLANT N/A 03/17/2017   Procedure: Pacemaker Implant;  Surgeon: Regan Lemmingamnitz, Will Martin, MD;  Location: [facility];  Service: Cardiovascular;  Laterality: N/A;  . RECTAL PROLAPSE REPAIR  2016  . TONSILLECTOMY    . TUBAL LIGATION      Family History  Problem Relation Age of Onset  . Renal Disease Mother   . Diabetes Mother   . COPD Father   . Hypertension Father   . Arthritis Sister   . Rheum arthritis Sister   . Diabetes Maternal Grandmother   . Glaucoma Sister   . Hypertension Sister   . Multiple sclerosis Sister   . Thyroid disease Neg Hx     Social History:  reports that she has never smoked. She has never used smokeless tobacco. She reports that she does not drink alcohol or use drugs.  Allergies:  Allergies  Allergen Reactions  . Lotrel [Amlodipine Besy-Benazepril Hcl] Swelling and Rash     Review of Systems     Examination:   BP 132/86 (BP Location: Left Arm, Patient Position: Sitting, Cuff Size: Normal)   Pulse 86   Ht 5\' 5"  (1.651 m)   Wt 167 lb 12.8 oz (76.1 kg)   SpO2 96%   BMI 27.92 kg/m   Thyroid not palpable No tremor Biceps reflexes appear normal    Assessment/Plan:   Hyperthyroidism, secondary from Graves' disease with recurrence in 2018  She has had minimal symptoms with her hyperthyroidism at baseline except fatigue  Although she has been off methimazole prior to her visit in June she appears to be having a relapse of her hyperthyroidism She is clinically doing well but labs are still  abnormal with taking low-dose methimazole every  other day Also thyrotropin receptor antibody is high  No goiter on exam  Discussed with the patient that most likely since she is getting recurrent or relapsing hyperthyroidism she may be a better candidate for I-131 treatment instead of antithyroid drugs She wants to hold off on this for now She will increase her methimazole to 1 tablet of 5 mg daily for now and follow-up in 6 weeks   There are no Patient Instructions on file for this visit.  Reather Littler 04/17/2018, 9:45 AM    Note: This office note was prepared with Dragon voice recognition system technology. Any transcriptional errors that result from this process are unintentional.

## 2018-05-02 ENCOUNTER — Telehealth: Payer: Self-pay | Admitting: Cardiology

## 2018-05-02 ENCOUNTER — Ambulatory Visit (INDEPENDENT_AMBULATORY_CARE_PROVIDER_SITE_OTHER): Payer: Medicare PPO | Admitting: *Deleted

## 2018-05-02 DIAGNOSIS — I495 Sick sinus syndrome: Secondary | ICD-10-CM

## 2018-05-02 NOTE — Telephone Encounter (Signed)
LMOVM reminding pt to send remote transmission.   

## 2018-05-03 NOTE — Progress Notes (Signed)
Remote pacemaker transmission.   

## 2018-05-12 ENCOUNTER — Telehealth: Payer: Self-pay | Admitting: Cardiology

## 2018-05-12 NOTE — Telephone Encounter (Signed)
New Message:   Patient is asking for clarification for what she seen on My Chart

## 2018-05-12 NOTE — Telephone Encounter (Signed)
Pt calling to see what the MyChart recall was for.  She thought she was supposed to come into the office. Informed pt she did not need to come to the office and that the reminder is for her remote check scheduled for November. Pt appreciates my call

## 2018-06-05 LAB — CUP PACEART REMOTE DEVICE CHECK
Brady Statistic AS VP Percent: 0.04 %
Brady Statistic RA Percent Paced: 5.51 %
Date Time Interrogation Session: 20190820204108
Implantable Lead Implant Date: 20180705
Implantable Lead Location: 753859
Implantable Lead Location: 753860
Implantable Lead Model: 5076
Lead Channel Impedance Value: 456 Ohm
Lead Channel Pacing Threshold Pulse Width: 0.4 ms
Lead Channel Sensing Intrinsic Amplitude: 1.75 mV
Lead Channel Sensing Intrinsic Amplitude: 8.25 mV
Lead Channel Setting Pacing Amplitude: 2 V
Lead Channel Setting Pacing Pulse Width: 0.4 ms
Lead Channel Setting Sensing Sensitivity: 2.8 mV
MDC IDC LEAD IMPLANT DT: 20180705
MDC IDC MSMT BATTERY REMAINING LONGEVITY: 172 mo
MDC IDC MSMT BATTERY VOLTAGE: 3.09 V
MDC IDC MSMT LEADCHNL RA IMPEDANCE VALUE: 361 Ohm
MDC IDC MSMT LEADCHNL RA IMPEDANCE VALUE: 532 Ohm
MDC IDC MSMT LEADCHNL RA PACING THRESHOLD AMPLITUDE: 0.75 V
MDC IDC MSMT LEADCHNL RA SENSING INTR AMPL: 1.75 mV
MDC IDC MSMT LEADCHNL RV IMPEDANCE VALUE: 399 Ohm
MDC IDC MSMT LEADCHNL RV PACING THRESHOLD AMPLITUDE: 1 V
MDC IDC MSMT LEADCHNL RV PACING THRESHOLD PULSEWIDTH: 0.4 ms
MDC IDC MSMT LEADCHNL RV SENSING INTR AMPL: 8.25 mV
MDC IDC PG IMPLANT DT: 20180705
MDC IDC SET LEADCHNL RV PACING AMPLITUDE: 2.5 V
MDC IDC STAT BRADY AP VP PERCENT: 0.06 %
MDC IDC STAT BRADY AP VS PERCENT: 5.23 %
MDC IDC STAT BRADY AS VS PERCENT: 94.67 %
MDC IDC STAT BRADY RV PERCENT PACED: 0.1 %

## 2018-06-07 ENCOUNTER — Other Ambulatory Visit (INDEPENDENT_AMBULATORY_CARE_PROVIDER_SITE_OTHER): Payer: Medicare PPO

## 2018-06-07 DIAGNOSIS — E0590 Thyrotoxicosis, unspecified without thyrotoxic crisis or storm: Secondary | ICD-10-CM | POA: Diagnosis not present

## 2018-06-07 LAB — TSH: TSH: 0.01 u[IU]/mL — ABNORMAL LOW (ref 0.35–4.50)

## 2018-06-07 LAB — T3, FREE: T3, Free: 4.5 pg/mL — ABNORMAL HIGH (ref 2.3–4.2)

## 2018-06-07 LAB — T4, FREE: FREE T4: 1.09 ng/dL (ref 0.60–1.60)

## 2018-06-14 ENCOUNTER — Encounter: Payer: Self-pay | Admitting: Endocrinology

## 2018-06-14 ENCOUNTER — Ambulatory Visit (INDEPENDENT_AMBULATORY_CARE_PROVIDER_SITE_OTHER): Payer: Medicare PPO | Admitting: Endocrinology

## 2018-06-14 VITALS — BP 118/80 | HR 62 | Ht 65.0 in | Wt 170.0 lb

## 2018-06-14 DIAGNOSIS — E0590 Thyrotoxicosis, unspecified without thyrotoxic crisis or storm: Secondary | ICD-10-CM | POA: Diagnosis not present

## 2018-06-14 MED ORDER — METHIMAZOLE 5 MG PO TABS: 5.0000 mg | ORAL_TABLET | Freq: Two times a day (BID) | ORAL | 3 refills | Status: DC

## 2018-06-14 NOTE — Patient Instructions (Signed)
Take methimazole 2 daily

## 2018-06-14 NOTE — Progress Notes (Signed)
Patient ID: Anna Gutierrez, female   DOB: [birth year], 68 y.o.   MRN: [id number]                                                                                                              Reason for Appointment:  Hyperthyroidism, follow-up  Referring physician: Ludwig Clarks   Chief complaint: Overactive thyroid   History of Present Illness:    On her initial consultation she was complaining that for several months she does not think she has as much stamina and is not able to walk as much as she can because of fatigue.  Did not have any symptoms of palpitations, shakiness, feeling excessively warm and sweaty, nervousness or weight loss She does not think she has any muscle weakness with going up stairs or getting up from squatting She has not had any change in her bowel habits or appetite Patient had a screening thyroid test done when she was admitted in July for symptomatic bradycardia and was found to be hyperthyroid She was discharged on 5 mg methimazole but she did not feel any different with this Her labs done by PCP in August showed a high normal total T4 and mildly increased T3 level of 212 with low TSH  She had not been on medications on her initial consultation in 9/18 Despite her lack of typical symptoms she had a significantly higher T3 level of 7.3 and free T4 for about 2.0 at baseline Thyrotropin receptor antibody was 34 She has been taking 10 mg of methimazole since 06/09/17  RECENT history:  Her dose of methimazole has been changed periodically since 12/18  She had a relapse of her hyperthyroidism with thyroid levels increasing above normal in 6/19 She was asymptomatic and only had a high T3 level Was taking methimazole every other day her thyroid levels did not improve in August and she is now taking 5 mg daily However has not had any recurrence of her symptoms of fatigue with Tylenol or weakness No recent significant weight change She has taken her methimazole  regularly  Labs show her TSH to be still undetectable and her free T3 is still above normal Thyrotropin receptor antibody still high at 17 as of 8/19   Wt Readings from Last 3 Encounters:  06/14/18 170 lb (77.1 kg)  04/17/18 167 lb 12.8 oz (76.1 kg)  02/20/18 169 lb (76.7 kg)      Thyroid function tests as follows:     Lab Results  Component Value Date   FREET4 1.09 06/07/2018   FREET4 1.19 04/14/2018   FREET4 1.22 02/17/2018   T3FREE 4.5 (H) 06/07/2018   T3FREE 4.9 (H) 04/14/2018   T3FREE 3.3 12/05/2017   TSH 0.01 (L) 06/07/2018   TSH <0.01 (L) 04/14/2018   TSH <0.01 (L) 02/17/2018    Lab Results  Component Value Date   THYROTRECAB 17.00 (H) 04/14/2018   THYROTRECAB 17.03 (H) 12/05/2017   THYROTRECAB 34.01 (H) 06/08/2017    BACKGROUND information:  At the age of 50 patient apparently had hyperthyroidism  diagnosed when she had a prominent thyroid in the front of her neck.  She also thinks she may have had weight loss, palpitations, jitteriness at that time  She was treated with radioactive iodine treatment and subsequently was started on thyroid was normal   Allergies as of 06/14/2018      Reactions   Lotrel [amlodipine Besy-benazepril Hcl] Swelling, Rash      Medication List        Accurate as of 06/14/18  8:28 AM. Always use your most recent med list.          acetaminophen 325 MG tablet Commonly known as:  TYLENOL Take 1-2 tablets (325-650 mg total) by mouth every 4 (four) hours as needed for mild pain.   amLODipine 10 MG tablet Commonly known as:  NORVASC Take 10 mg by mouth daily.   cholecalciferol 1000 units tablet Commonly known as:  VITAMIN D Take 1,000 Units by mouth daily.   loperamide 2 MG capsule Commonly known as:  IMODIUM Take 6 mg by mouth 4 (four) times daily as needed for diarrhea or loose stools.   methimazole 5 MG tablet Commonly known as:  TAPAZOLE Take 1 tablet (5 mg total) by mouth daily.           Past Medical History:   Diagnosis Date  . Hypertension   . Symptomatic bradycardia 03/16/2017  . Thyroid goiter    had radioactive iodine for treatment    Past Surgical History:  Procedure Laterality Date  . CHOLECYSTECTOMY    . LEAD REVISION/REPAIR N/A 05/02/2017   Procedure: Lead Revision/Repair;  Surgeon: Regan Lemming, MD;  Location: [facility];  Service: Cardiovascular;  Laterality: N/A;  . PACEMAKER IMPLANT N/A 03/17/2017   Procedure: Pacemaker Implant;  Surgeon: Regan Lemming, MD;  Location: [facility];  Service: Cardiovascular;  Laterality: N/A;  . RECTAL PROLAPSE REPAIR  2016  . TONSILLECTOMY    . TUBAL LIGATION      Family History  Problem Relation Age of Onset  . Renal Disease Mother   . Diabetes Mother   . COPD Father   . Hypertension Father   . Arthritis Sister   . Rheum arthritis Sister   . Diabetes Maternal Grandmother   . Glaucoma Sister   . Hypertension Sister   . Multiple sclerosis Sister   . Thyroid disease Neg Hx     Social History:  reports that she has never smoked. She has never used smokeless tobacco. She reports that she does not drink alcohol or use drugs.  Allergies:  Allergies  Allergen Reactions  . Lotrel [Amlodipine Besy-Benazepril Hcl] Swelling and Rash     Review of Systems     Examination:   BP 118/80   Pulse 62   Ht 5\' 5"  (1.651 m)   Wt 170 lb (77.1 kg)   SpO2 97%   BMI 28.29 kg/m   Thyroid not palpable Skin not unusually warm No tremor Biceps reflexes show normal relaxation, 1+    Assessment/Plan:   Hyperthyroidism, secondary from Graves' disease with recurrence in 2018, prior treatment with I-131  She has had minimal symptoms with her hyperthyroidism   She continues to have a relapse of her hyperthyroidism but mostly in the high T3 level Currently with 5 mg every other day of methimazole she is not showing significant improvement in her T3 levels Also thyrotropin receptor antibody is significantly high as  of 8/19  No goiter on exam  Discussed with the  patient that with her continued to have hyperthyroidism with no improvement with methimazole since treatment was restarted last year she should prefer to be treated with I-131 This is especially with her thyrotropin receptor antibody being high She is still not keen on doing this  For now we will increase her methimazole to 5 mg twice daily and follow-up in 6 weeks   There are no Patient Instructions on file for this visit.  Reather Littler 06/14/2018, 8:28 AM    Note: This office note was prepared with Dragon voice recognition system technology. Any transcriptional errors that result from this process are unintentional.

## 2018-06-27 ENCOUNTER — Other Ambulatory Visit (INDEPENDENT_AMBULATORY_CARE_PROVIDER_SITE_OTHER): Payer: Self-pay | Admitting: Orthopaedic Surgery

## 2018-07-02 ENCOUNTER — Other Ambulatory Visit (INDEPENDENT_AMBULATORY_CARE_PROVIDER_SITE_OTHER): Payer: Self-pay | Admitting: Orthopaedic Surgery

## 2018-07-03 NOTE — Telephone Encounter (Signed)
yes

## 2018-07-03 NOTE — Telephone Encounter (Signed)
Ok to rf? 

## 2018-07-27 ENCOUNTER — Other Ambulatory Visit (INDEPENDENT_AMBULATORY_CARE_PROVIDER_SITE_OTHER): Payer: Medicare PPO

## 2018-07-27 DIAGNOSIS — E0590 Thyrotoxicosis, unspecified without thyrotoxic crisis or storm: Secondary | ICD-10-CM

## 2018-07-27 LAB — TSH: TSH: 0.19 u[IU]/mL — ABNORMAL LOW (ref 0.35–4.50)

## 2018-07-27 LAB — T3, FREE: T3, Free: 3.8 pg/mL (ref 2.3–4.2)

## 2018-07-27 LAB — T4, FREE: Free T4: 0.83 ng/dL (ref 0.60–1.60)

## 2018-07-30 NOTE — Progress Notes (Signed)
Patient ID: Anna Gutierrez, female   DOB: 1951-03-23, 67 y.o.   MRN: [id number]                                                                                                              Reason for Appointment:  Hyperthyroidism, follow-up  Referring physician: Ludwig Clarks   Chief complaint: Overactive thyroid   History of Present Illness:    On her initial consultation she was complaining that for several months she does not think she has as much stamina and is not able to walk as much as she can because of fatigue.  Did not have any symptoms of palpitations, shakiness, feeling excessively warm and sweaty, nervousness or weight loss She does not think she has any muscle weakness with going up stairs or getting up from squatting She has not had any change in her bowel habits or appetite Patient had a screening thyroid test done when she was admitted in July for symptomatic bradycardia and was found to be hyperthyroid She was discharged on 5 mg methimazole but she did not feel any different with this Her labs done by PCP in August 2018 showed a high normal total T4 and mildly increased T3 level of 212 with low TSH  She had not been on medications on her initial consultation in 9/18 Despite her lack of typical symptoms she had a significantly higher T3 level of 7.3 and free T4 for about 2.0 at baseline Thyrotropin receptor antibody was 34 She has been taking 10 mg of methimazole since 06/09/17  RECENT history:  Her dose of methimazole has been changed periodically since 12/18  She had a relapse of her hyperthyroidism with thyroid levels increasing above normal in 6/19 She was asymptomatic and only had a high T3 level  However has appeared to be requiring gradually increasing doses of methimazole subsequently  Currently taking 5 mg methimazole twice daily  As before she has minimal symptoms of hyperthyroidism and no unusual fatigue and no change in how she feels with dosage adjustment  on the last visit  She has taken her methimazole as directed regularly  Labs show her TSH to be not as suppressed Her free T3 is back to normal now and free T4 is slightly lower  Thyrotropin receptor antibody still high at 17 as of 8/19   Wt Readings from Last 3 Encounters:  07/31/18 169 lb (76.7 kg)  06/14/18 170 lb (77.1 kg)  04/17/18 167 lb 12.8 oz (76.1 kg)      Thyroid function tests as follows:     Lab Results  Component Value Date   FREET4 0.83 07/27/2018   FREET4 1.09 06/07/2018   FREET4 1.19 04/14/2018   T3FREE 3.8 07/27/2018   T3FREE 4.5 (H) 06/07/2018   T3FREE 4.9 (H) 04/14/2018   TSH 0.19 (L) 07/27/2018   TSH 0.01 (L) 06/07/2018   TSH <0.01 (L) 04/14/2018    Lab Results  Component Value Date   THYROTRECAB 17.00 (H) 04/14/2018   THYROTRECAB 17.03 (H) 12/05/2017   THYROTRECAB  34.01 (H) 06/08/2017    BACKGROUND information:  At the age of 67 patient apparently had hyperthyroidism diagnosed when she had a prominent thyroid in the front of her neck.  She also thinks she may have had weight loss, palpitations, jitteriness at that time  She was treated with radioactive iodine treatment and subsequently was started on thyroid was normal   Allergies as of 07/31/2018      Reactions   Lotrel [amlodipine Besy-benazepril Hcl] Swelling, Rash      Medication List        Accurate as of 07/31/18  4:27 PM. Always use your most recent med list.          acetaminophen 325 MG tablet Commonly known as:  TYLENOL Take 1-2 tablets (325-650 mg total) by mouth every 4 (four) hours as needed for mild pain.   amLODipine 10 MG tablet Commonly known as:  NORVASC Take 10 mg by mouth daily.   cholecalciferol 1000 units tablet Commonly known as:  VITAMIN D Take 1,000 Units by mouth daily.   loperamide 2 MG capsule Commonly known as:  IMODIUM Take 6 mg by mouth 4 (four) times daily as needed for diarrhea or loose stools.   methimazole 5 MG tablet Commonly known  as:  TAPAZOLE Take 1 tablet (5 mg total) by mouth 2 (two) times daily.   tiZANidine 4 MG tablet Commonly known as:  ZANAFLEX TAKE 1 TABLET BY MOUTH EVERY 6 HOURS IF NEEDED FOR MUSCLE SPASM           Past Medical History:  Diagnosis Date  . Hypertension   . Symptomatic bradycardia 03/16/2017  . Thyroid goiter    had radioactive iodine for treatment    Past Surgical History:  Procedure Laterality Date  . CHOLECYSTECTOMY    . LEAD REVISION/REPAIR N/A 05/02/2017   Procedure: Lead Revision/Repair;  Surgeon: Regan Lemmingamnitz, Will Martin, MD;  Location: [facility];  Service: Cardiovascular;  Laterality: N/A;  . PACEMAKER IMPLANT N/A 03/17/2017   Procedure: Pacemaker Implant;  Surgeon: Regan Lemmingamnitz, Will Martin, MD;  Location: [facility];  Service: Cardiovascular;  Laterality: N/A;  . RECTAL PROLAPSE REPAIR  2016  . TONSILLECTOMY    . TUBAL LIGATION      Family History  Problem Relation Age of Onset  . Renal Disease Mother   . Diabetes Mother   . COPD Father   . Hypertension Father   . Arthritis Sister   . Rheum arthritis Sister   . Diabetes Maternal Grandmother   . Glaucoma Sister   . Hypertension Sister   . Multiple sclerosis Sister   . Thyroid disease Neg Hx     Social History:  reports that she has never smoked. She has never used smokeless tobacco. She reports that she does not drink alcohol or use drugs.  Allergies:  Allergies  Allergen Reactions  . Lotrel [Amlodipine Besy-Benazepril Hcl] Swelling and Rash     Review of Systems  Hypertension followed by PCP   Examination:   BP 118/78 (BP Location: Left Arm, Patient Position: Sitting, Cuff Size: Normal)   Pulse 81   Ht 5\' 5"  (1.651 m)   Wt 169 lb (76.7 kg)   SpO2 96%   BMI 28.12 kg/m   Thyroid not palpable No eye signs No tremor Biceps reflexes show normal relaxation    Assessment/Plan:   Hyperthyroidism, secondary from Graves' disease, prior treatment with I-131 several years ago  She has  had minimal symptoms with her hyperthyroidism  She continues to have a relapse of her hyperthyroidism requiring gradually increasing doses of methimazole  Now with taking 5 mg twice daily of methimazole her thyroid levels are normalized although TSH not back to normal as yet She is subjectively doing well and has no thyroid enlargement Recently thyrotropin receptor antibody is significantly high as of 8/19  No goiter on exam  Discussed current treatment plans and history Also discussed possibility of doing I-131 treatment again especially with no consistent improvement in her hyperthyroidism and thyrotropin receptor antibody  Will continue her on the same dose and follow-up in 6 weeks   There are no Patient Instructions on file for this visit.  Reather Littler 07/31/2018, 4:27 PM    Note: This office note was prepared with Dragon voice recognition system technology. Any transcriptional errors that result from this process are unintentional.

## 2018-07-31 ENCOUNTER — Ambulatory Visit (INDEPENDENT_AMBULATORY_CARE_PROVIDER_SITE_OTHER): Payer: Medicare PPO | Admitting: Endocrinology

## 2018-07-31 ENCOUNTER — Encounter: Payer: Self-pay | Admitting: Endocrinology

## 2018-07-31 VITALS — BP 118/78 | HR 81 | Ht 65.0 in | Wt 169.0 lb

## 2018-07-31 DIAGNOSIS — E0590 Thyrotoxicosis, unspecified without thyrotoxic crisis or storm: Secondary | ICD-10-CM

## 2018-08-01 ENCOUNTER — Ambulatory Visit (INDEPENDENT_AMBULATORY_CARE_PROVIDER_SITE_OTHER): Payer: Medicare PPO

## 2018-08-01 ENCOUNTER — Telehealth: Payer: Self-pay

## 2018-08-01 DIAGNOSIS — I495 Sick sinus syndrome: Secondary | ICD-10-CM | POA: Diagnosis not present

## 2018-08-01 NOTE — Telephone Encounter (Signed)
LMOVM reminding pt to send remote transmission.   

## 2018-08-03 NOTE — Progress Notes (Signed)
Remote pacemaker transmission.   

## 2018-09-11 ENCOUNTER — Other Ambulatory Visit (INDEPENDENT_AMBULATORY_CARE_PROVIDER_SITE_OTHER): Payer: Medicare PPO

## 2018-09-11 DIAGNOSIS — E0590 Thyrotoxicosis, unspecified without thyrotoxic crisis or storm: Secondary | ICD-10-CM

## 2018-09-11 LAB — TSH: TSH: 1.92 u[IU]/mL (ref 0.35–4.50)

## 2018-09-11 LAB — T4, FREE: Free T4: 0.69 ng/dL (ref 0.60–1.60)

## 2018-09-11 LAB — T3, FREE: T3 FREE: 3.5 pg/mL (ref 2.3–4.2)

## 2018-09-12 LAB — THYROTROPIN RECEPTOR AUTOABS: Thyrotropin Receptor Ab: 13.3 IU/L — ABNORMAL HIGH (ref 0.00–1.75)

## 2018-09-14 ENCOUNTER — Encounter: Payer: Self-pay | Admitting: Endocrinology

## 2018-09-14 ENCOUNTER — Ambulatory Visit (INDEPENDENT_AMBULATORY_CARE_PROVIDER_SITE_OTHER): Payer: Medicare PPO | Admitting: Endocrinology

## 2018-09-14 VITALS — BP 140/72 | HR 74 | Ht 65.0 in | Wt 170.8 lb

## 2018-09-14 DIAGNOSIS — E0590 Thyrotoxicosis, unspecified without thyrotoxic crisis or storm: Secondary | ICD-10-CM | POA: Diagnosis not present

## 2018-09-14 NOTE — Patient Instructions (Signed)
Reduce to 1 pill daily

## 2018-09-14 NOTE — Progress Notes (Signed)
Patient ID: Anna Gutierrez, female   DOB: August 06, 1951, 68 y.o.   MRN: [id number]                                                                                                              Reason for Appointment:  Hyperthyroidism, follow-up  Referring physician: Ludwig ClarksMoreira   Chief complaint: Overactive thyroid   History of Present Illness:    On her initial consultation she was complaining that for several months she does not think she has as much stamina and is not able to walk as much as she can because of fatigue.  Did not have any symptoms of palpitations, shakiness, feeling excessively warm and sweaty, nervousness or weight loss She does not think she has any muscle weakness with going up stairs or getting up from squatting She has not had any change in her bowel habits or appetite Patient had a screening thyroid test done when she was admitted in July for symptomatic bradycardia and was found to be hyperthyroid She was discharged on 5 mg methimazole but she did not feel any different with this Her labs done by PCP in August 2018 showed a high normal total T4 and mildly increased T3 level of 212 with low TSH  She had not been on medications on her initial consultation in 9/18 Despite her lack of typical symptoms she had a significantly higher T3 level of 7.3 and free T4 for about 2.0 at baseline Thyrotropin receptor antibody was 34 She has been taking 10 mg of methimazole since 06/09/17  RECENT history:  Her dose of methimazole has been changed periodically since 12/18  She had a relapse of her hyperthyroidism with thyroid levels increasing above normal in 6/19 She was asymptomatic and only had a high T3 level  Currently taking 5 mg methimazole twice daily  As before she has no recent symptoms of unusual fatigue, heat or cold intolerance or weight change Was having some night sweats but not now No drowsiness She has taken her methimazole twice a day as directed  regularly  Labs show her TSH to be rising up to 1.9 Also her free T4 is low normal Free T3 is improving and not as suppressed Thyrotropin receptor antibody has improved slightly   Wt Readings from Last 3 Encounters:  09/14/18 170 lb 12.8 oz (77.5 kg)  07/31/18 169 lb (76.7 kg)  06/14/18 170 lb (77.1 kg)      Thyroid function tests as follows:     Lab Results  Component Value Date   FREET4 0.69 09/11/2018   FREET4 0.83 07/27/2018   FREET4 1.09 06/07/2018   T3FREE 3.5 09/11/2018   T3FREE 3.8 07/27/2018   T3FREE 4.5 (H) 06/07/2018   TSH 1.92 09/11/2018   TSH 0.19 (L) 07/27/2018   TSH 0.01 (L) 06/07/2018    Lab Results  Component Value Date   THYROTRECAB 13.30 (H) 09/11/2018   THYROTRECAB 17.00 (H) 04/14/2018   THYROTRECAB 17.03 (H) 12/05/2017    BACKGROUND information:  At the age of 68 patient  apparently had hyperthyroidism diagnosed when she had a prominent thyroid in the front of her neck.  She also thinks she may have had weight loss, palpitations, jitteriness at that time  She was treated with radioactive iodine treatment and subsequently was started on thyroid was normal   Allergies as of 09/14/2018      Reactions   Lotrel [amlodipine Besy-benazepril Hcl] Swelling, Rash      Medication List       Accurate as of September 14, 2018  8:14 AM. Always use your most recent med list.        acetaminophen 325 MG tablet Commonly known as:  TYLENOL Take 1-2 tablets (325-650 mg total) by mouth every 4 (four) hours as needed for mild pain.   amLODipine 10 MG tablet Commonly known as:  NORVASC Take 10 mg by mouth daily.   cholecalciferol 1000 units tablet Commonly known as:  VITAMIN D Take 1,000 Units by mouth daily.   loperamide 2 MG capsule Commonly known as:  IMODIUM Take 6 mg by mouth 4 (four) times daily as needed for diarrhea or loose stools.   methimazole 5 MG tablet Commonly known as:  TAPAZOLE Take 1 tablet (5 mg total) by mouth 2 (two) times  daily.   tiZANidine 4 MG tablet Commonly known as:  ZANAFLEX TAKE 1 TABLET BY MOUTH EVERY 6 HOURS IF NEEDED FOR MUSCLE SPASM           Past Medical History:  Diagnosis Date  . Hypertension   . Symptomatic bradycardia 03/16/2017  . Thyroid goiter    had radioactive iodine for treatment    Past Surgical History:  Procedure Laterality Date  . CHOLECYSTECTOMY    . LEAD REVISION/REPAIR N/A 05/02/2017   Procedure: Lead Revision/Repair;  Surgeon: Regan Lemming, MD;  Location: [facility];  Service: Cardiovascular;  Laterality: N/A;  . PACEMAKER IMPLANT N/A 03/17/2017   Procedure: Pacemaker Implant;  Surgeon: Regan Lemming, MD;  Location: [facility];  Service: Cardiovascular;  Laterality: N/A;  . RECTAL PROLAPSE REPAIR  2016  . TONSILLECTOMY    . TUBAL LIGATION      Family History  Problem Relation Age of Onset  . Renal Disease Mother   . Diabetes Mother   . COPD Father   . Hypertension Father   . Arthritis Sister   . Rheum arthritis Sister   . Diabetes Maternal Grandmother   . Glaucoma Sister   . Hypertension Sister   . Multiple sclerosis Sister   . Thyroid disease Neg Hx     Social History:  reports that she has never smoked. She has never used smokeless tobacco. She reports that she does not drink alcohol or use drugs.  Allergies:  Allergies  Allergen Reactions  . Lotrel [Amlodipine Besy-Benazepril Hcl] Swelling and Rash     Review of Systems  Hypertension followed by PCP   Examination:   BP 140/72 (BP Location: Left Arm, Patient Position: Sitting, Cuff Size: Normal)   Pulse 74   Ht 5\' 5"  (1.651 m)   Wt 170 lb 12.8 oz (77.5 kg)   SpO2 94%   BMI 28.42 kg/m   Mild frontal alopecia present Eyes do not show any prominence or swelling Thyroid not palpable Skin appears normal Biceps reflexes show normal relaxation    Assessment/Plan:   Hyperthyroidism, secondary from Graves' disease, prior treatment with I-131 several years  ago  She has had minimal symptoms with her hyperthyroidism   For some time  is been on 5 mg twice daily of methimazole and now showing a trend to her thyroid levels getting low normal including free T4 and TSH now not suppressed as before Objectively no signs of hyperthyroidism and she feels fairly well Recently thyrotropin receptor antibody is finally starting to improve but still higher than normal  No goiter on exam again  She is showing some reduction in her thyroid levels low normal levels and thyrotropin antibody is finally improving she may have a chance of getting into remission without I-131 and this was discussed She will reduce her dose down to 5 mg and follow-up again in 6 weeks   There are no Patient Instructions on file for this visit.  Reather Littler 09/14/2018, 8:14 AM    Note: This office note was prepared with Dragon voice recognition system technology. Any transcriptional errors that result from this process are unintentional.

## 2018-09-27 LAB — CUP PACEART REMOTE DEVICE CHECK
Battery Remaining Longevity: 169 mo
Battery Voltage: 3.06 V
Brady Statistic AP VP Percent: 0.1 %
Brady Statistic AP VS Percent: 3.6 %
Brady Statistic AS VP Percent: 0.04 %
Brady Statistic AS VS Percent: 96.27 %
Brady Statistic RA Percent Paced: 3.92 %
Brady Statistic RV Percent Paced: 0.14 %
Date Time Interrogation Session: 20191119210509
Implantable Lead Implant Date: 20180705
Implantable Lead Implant Date: 20180705
Implantable Lead Location: 753859
Implantable Lead Location: 753860
Implantable Lead Model: 5076
Implantable Lead Model: 5076
Implantable Pulse Generator Implant Date: 20180705
Lead Channel Impedance Value: 361 Ohm
Lead Channel Impedance Value: 380 Ohm
Lead Channel Impedance Value: 437 Ohm
Lead Channel Impedance Value: 551 Ohm
Lead Channel Pacing Threshold Amplitude: 0.75 V
Lead Channel Pacing Threshold Amplitude: 0.875 V
Lead Channel Pacing Threshold Pulse Width: 0.4 ms
Lead Channel Pacing Threshold Pulse Width: 0.4 ms
Lead Channel Sensing Intrinsic Amplitude: 2 mV
Lead Channel Sensing Intrinsic Amplitude: 2 mV
Lead Channel Sensing Intrinsic Amplitude: 7.75 mV
Lead Channel Sensing Intrinsic Amplitude: 7.75 mV
Lead Channel Setting Pacing Amplitude: 2 V
Lead Channel Setting Pacing Amplitude: 2.5 V
Lead Channel Setting Pacing Pulse Width: 0.4 ms
Lead Channel Setting Sensing Sensitivity: 2.8 mV

## 2018-10-25 ENCOUNTER — Other Ambulatory Visit (INDEPENDENT_AMBULATORY_CARE_PROVIDER_SITE_OTHER): Payer: Medicare PPO

## 2018-10-25 DIAGNOSIS — E0590 Thyrotoxicosis, unspecified without thyrotoxic crisis or storm: Secondary | ICD-10-CM | POA: Diagnosis not present

## 2018-10-25 LAB — T3, FREE: T3, Free: 3.4 pg/mL (ref 2.3–4.2)

## 2018-10-25 LAB — T4, FREE: Free T4: 0.95 ng/dL (ref 0.60–1.60)

## 2018-10-25 LAB — TSH: TSH: 0.73 u[IU]/mL (ref 0.35–4.50)

## 2018-10-26 NOTE — Progress Notes (Signed)
Patient ID: Anna Gutierrez, female   DOB: Aug 08, 1951, 68 y.o.   MRN: [id number]                                                                                                              Reason for Appointment:  Hyperthyroidism, follow-up  Referring physician: Ludwig Clarks   Chief complaint: Follow-up   History of Present Illness:    On her initial consultation she was complaining that for several months she does not think she has as much stamina and is not able to walk as much as she can because of fatigue.  Did not have any symptoms of palpitations, shakiness, feeling excessively warm and sweaty, nervousness or weight loss She does not think she has any muscle weakness with going up stairs or getting up from squatting She has not had any change in her bowel habits or appetite Patient had a screening thyroid test done when she was admitted in July for symptomatic bradycardia and was found to be hyperthyroid She was discharged on 5 mg methimazole but she did not feel any different with this Her labs done by PCP in August 2018 showed a high normal total T4 and mildly increased T3 level of 212 with low TSH  She had not been on medications on her initial consultation in 9/18 Despite her lack of typical symptoms she had a significantly higher T3 level of 7.3 and free T4 for about 2.0 at baseline Thyrotropin receptor antibody was 34 She has been taking 10 mg of methimazole since 06/09/17  RECENT history:  Her dose of methimazole has been changed periodically since 12/18  She had a relapse of her hyperthyroidism with thyroid levels increasing above normal in 6/19 She was asymptomatic and only had a high T3 level  Currently taking 5 mg methimazole once daily, the dose was reduced in 1/20 1 her free T4 was low normal  Does not think she has had increased fatigue or drowsiness, also no shakiness, heat or cold intolerance Weight is about the same  She takes her methimazole regularly  Labs  show her TSH still normal although slightly lower at 0.7 Free T4 is not as low at 0.95 Free T3 is about the same  Thyrotropin receptor antibody has improved December but still significantly high    Wt Readings from Last 3 Encounters:  10/27/18 168 lb (76.2 kg)  09/14/18 170 lb 12.8 oz (77.5 kg)  07/31/18 169 lb (76.7 kg)      Thyroid function tests as follows:     Lab Results  Component Value Date   FREET4 0.95 10/25/2018   FREET4 0.69 09/11/2018   FREET4 0.83 07/27/2018   T3FREE 3.4 10/25/2018   T3FREE 3.5 09/11/2018   T3FREE 3.8 07/27/2018   TSH 0.73 10/25/2018   TSH 1.92 09/11/2018   TSH 0.19 (L) 07/27/2018    Lab Results  Component Value Date   THYROTRECAB 13.30 (H) 09/11/2018   THYROTRECAB 17.00 (H) 04/14/2018   THYROTRECAB 17.03 (H) 12/05/2017    BACKGROUND information:  At the age of 68 patient apparently had hyperthyroidism diagnosed when she had a prominent thyroid in the front of her neck.  She also thinks she may have had weight loss, palpitations, jitteriness at that time  She was treated with radioactive iodine treatment and subsequently was started on thyroid was normal   Allergies as of 10/27/2018      Reactions   Lotrel [amlodipine Besy-benazepril Hcl] Swelling, Rash      Medication List       Accurate as of October 27, 2018  8:56 AM. Always use your most recent med list.        acetaminophen 325 MG tablet Commonly known as:  TYLENOL Take 1-2 tablets (325-650 mg total) by mouth every 4 (four) hours as needed for mild pain.   amLODipine 10 MG tablet Commonly known as:  NORVASC Take 10 mg by mouth daily.   cholecalciferol 1000 units tablet Commonly known as:  VITAMIN D Take 1,000 Units by mouth daily.   loperamide 2 MG capsule Commonly known as:  IMODIUM Take 6 mg by mouth 4 (four) times daily as needed for diarrhea or loose stools.   methimazole 5 MG tablet Commonly known as:  TAPAZOLE Take 5 mg by mouth daily. TAKE 1 TABLET BY  MOUTH ONCE DAILY.   tiZANidine 4 MG tablet Commonly known as:  ZANAFLEX TAKE 1 TABLET BY MOUTH EVERY 6 HOURS IF NEEDED FOR MUSCLE SPASM           Past Medical History:  Diagnosis Date  . Hypertension   . Symptomatic bradycardia 03/16/2017  . Thyroid goiter    had radioactive iodine for treatment    Past Surgical History:  Procedure Laterality Date  . CHOLECYSTECTOMY    . LEAD REVISION/REPAIR N/A 05/02/2017   Procedure: Lead Revision/Repair;  Surgeon: Regan Lemmingamnitz, Will Martin, MD;  Location: [facility];  Service: Cardiovascular;  Laterality: N/A;  . PACEMAKER IMPLANT N/A 03/17/2017   Procedure: Pacemaker Implant;  Surgeon: Regan Lemmingamnitz, Will Martin, MD;  Location: [facility];  Service: Cardiovascular;  Laterality: N/A;  . RECTAL PROLAPSE REPAIR  2016  . TONSILLECTOMY    . TUBAL LIGATION      Family History  Problem Relation Age of Onset  . Renal Disease Mother   . Diabetes Mother   . COPD Father   . Hypertension Father   . Arthritis Sister   . Rheum arthritis Sister   . Diabetes Maternal Grandmother   . Glaucoma Sister   . Hypertension Sister   . Multiple sclerosis Sister   . Thyroid disease Neg Hx     Social History:  reports that she has never smoked. She has never used smokeless tobacco. She reports that she does not drink alcohol or use drugs.  Allergies:  Allergies  Allergen Reactions  . Lotrel [Amlodipine Besy-Benazepril Hcl] Swelling and Rash     Review of Systems  Hypertension followed by PCP   Examination:   BP 118/72 (BP Location: Left Arm, Patient Position: Sitting, Cuff Size: Normal)   Pulse 76   Ht 5\' 5"  (1.651 m)   Wt 168 lb (76.2 kg)   SpO2 99%   BMI 27.96 kg/m   She looks well Skin appears normal Biceps reflexes show normal relaxation    Assessment/Plan:   Hyperthyroidism, secondary from Graves' disease, prior treatment with I-131 several years ago  She has had minimal symptoms baseline with her hyperthyroidism     Currently is on 5 mg methimazole since 09/2018  when her dose was reduced Again subjectively doing well Exam is euthyroid  Her thyroid levels are excellent and previously had a low normal free T4 with 10 mg methimazole but they are now mid normal Since TSH is below 1.0 will not attempt to reduce her medication as yet She needs to follow-up in 2 months Again discussed that if she continues to be dependent on methimazole may consider I-131 this summer  Recheck thyrotropin receptor antibody on the next visit also which was previously high    There are no Patient Instructions on file for this visit.  Reather LittlerAjay Marceline Napierala 10/27/2018, 8:56 AM    Note: This office note was prepared with Dragon voice recognition system technology. Any transcriptional errors that result from this process are unintentional.

## 2018-10-27 ENCOUNTER — Encounter: Payer: Self-pay | Admitting: Endocrinology

## 2018-10-27 ENCOUNTER — Ambulatory Visit (INDEPENDENT_AMBULATORY_CARE_PROVIDER_SITE_OTHER): Payer: Medicare PPO | Admitting: Endocrinology

## 2018-10-27 VITALS — BP 118/72 | HR 76 | Ht 65.0 in | Wt 168.0 lb

## 2018-10-27 DIAGNOSIS — E0590 Thyrotoxicosis, unspecified without thyrotoxic crisis or storm: Secondary | ICD-10-CM

## 2018-10-31 ENCOUNTER — Ambulatory Visit (INDEPENDENT_AMBULATORY_CARE_PROVIDER_SITE_OTHER): Payer: Medicare PPO

## 2018-10-31 DIAGNOSIS — I495 Sick sinus syndrome: Secondary | ICD-10-CM

## 2018-11-01 LAB — CUP PACEART REMOTE DEVICE CHECK
Battery Voltage: 3.05 V
Brady Statistic AP VP Percent: 0.04 %
Brady Statistic AP VS Percent: 1.39 %
Brady Statistic AS VP Percent: 0.04 %
Brady Statistic AS VS Percent: 98.52 %
Brady Statistic RA Percent Paced: 1.63 %
Brady Statistic RV Percent Paced: 0.08 %
Date Time Interrogation Session: 20200218112431
Implantable Lead Implant Date: 20180705
Implantable Lead Location: 753859
Implantable Lead Location: 753860
Implantable Lead Model: 5076
Implantable Lead Model: 5076
Implantable Pulse Generator Implant Date: 20180705
Lead Channel Impedance Value: 342 Ohm
Lead Channel Impedance Value: 380 Ohm
Lead Channel Impedance Value: 418 Ohm
Lead Channel Impedance Value: 494 Ohm
Lead Channel Pacing Threshold Amplitude: 0.75 V
Lead Channel Pacing Threshold Amplitude: 1 V
Lead Channel Pacing Threshold Pulse Width: 0.4 ms
Lead Channel Sensing Intrinsic Amplitude: 2.25 mV
Lead Channel Sensing Intrinsic Amplitude: 7.75 mV
Lead Channel Sensing Intrinsic Amplitude: 7.75 mV
Lead Channel Setting Pacing Amplitude: 2 V
Lead Channel Setting Pacing Amplitude: 2.5 V
Lead Channel Setting Pacing Pulse Width: 0.4 ms
Lead Channel Setting Sensing Sensitivity: 2.8 mV
MDC IDC LEAD IMPLANT DT: 20180705
MDC IDC MSMT BATTERY REMAINING LONGEVITY: 166 mo
MDC IDC MSMT LEADCHNL RA PACING THRESHOLD PULSEWIDTH: 0.4 ms
MDC IDC MSMT LEADCHNL RA SENSING INTR AMPL: 2.25 mV

## 2018-11-08 NOTE — Progress Notes (Signed)
Remote pacemaker transmission.   

## 2018-11-13 ENCOUNTER — Encounter: Payer: Self-pay | Admitting: Cardiology

## 2018-11-17 ENCOUNTER — Other Ambulatory Visit: Payer: Self-pay | Admitting: Internal Medicine

## 2018-11-17 DIAGNOSIS — Z1231 Encounter for screening mammogram for malignant neoplasm of breast: Secondary | ICD-10-CM

## 2018-12-14 ENCOUNTER — Ambulatory Visit: Payer: Medicare PPO

## 2018-12-19 ENCOUNTER — Other Ambulatory Visit: Payer: Self-pay | Admitting: Endocrinology

## 2018-12-27 ENCOUNTER — Other Ambulatory Visit (INDEPENDENT_AMBULATORY_CARE_PROVIDER_SITE_OTHER): Payer: Medicare PPO

## 2018-12-27 ENCOUNTER — Other Ambulatory Visit: Payer: Self-pay

## 2018-12-27 DIAGNOSIS — E0590 Thyrotoxicosis, unspecified without thyrotoxic crisis or storm: Secondary | ICD-10-CM | POA: Diagnosis not present

## 2018-12-27 LAB — T4, FREE: Free T4: 0.8 ng/dL (ref 0.60–1.60)

## 2018-12-27 LAB — T3, FREE: T3, Free: 3.7 pg/mL (ref 2.3–4.2)

## 2018-12-27 LAB — TSH: TSH: 4.08 u[IU]/mL (ref 0.35–4.50)

## 2018-12-28 ENCOUNTER — Other Ambulatory Visit: Payer: Self-pay

## 2018-12-28 LAB — THYROTROPIN RECEPTOR AUTOABS: Thyrotropin Receptor Ab: 14 IU/L — ABNORMAL HIGH (ref 0.00–1.75)

## 2018-12-29 ENCOUNTER — Encounter: Payer: Self-pay | Admitting: Endocrinology

## 2018-12-29 ENCOUNTER — Ambulatory Visit (INDEPENDENT_AMBULATORY_CARE_PROVIDER_SITE_OTHER): Payer: Medicare PPO | Admitting: Endocrinology

## 2018-12-29 ENCOUNTER — Other Ambulatory Visit: Payer: Self-pay

## 2018-12-29 DIAGNOSIS — E0590 Thyrotoxicosis, unspecified without thyrotoxic crisis or storm: Secondary | ICD-10-CM

## 2018-12-29 NOTE — Progress Notes (Signed)
Patient ID: JNYAH BRAZEE, female   DOB: Jun 16, 1951, 68 y.o.   MRN: [id number]                                                                                                              Reason for Appointment:  Hyperthyroidism, follow-up   Today's office visit was provided via telemedicine using audio phone call since video methodology was not successful  Explained to the patient and the the limitations of evaluation and management by telemedicine and the availability of in person appointments.  The patient understood the limitations and agreed to proceed. Patient also understood that the telehealth visit is billable. . Location of the patient: Home . Location of the provider: Office Only the patient and myself were participating in the encounter    Referring physician: Ludwig Clarks   Chief complaint: Follow-up   History of Present Illness:    On her initial consultation she was complaining that for several months she does not think she has as much stamina and is not able to walk as much as she can because of fatigue.  Did not have any symptoms of palpitations, shakiness, feeling excessively warm and sweaty, nervousness or weight loss She does not think she has any muscle weakness with going up stairs or getting up from squatting She has not had any change in her bowel habits or appetite Patient had a screening thyroid test done when she was admitted in July for symptomatic bradycardia and was found to be hyperthyroid She was discharged on 5 mg methimazole but she did not feel any different with this Her labs done by PCP in August 2018 showed a high normal total T4 and mildly increased T3 level of 212 with low TSH  She had not been on medications on her initial consultation in 9/18 Despite her lack of typical symptoms she had a significantly higher T3 level of 7.3 and free T4 for about 2.0 at baseline Thyrotropin receptor antibody was 34 She has been taking 10 mg of methimazole  since 06/09/17  RECENT history:  Her dose of methimazole has been changed periodically since 12/18  She had a relapse of her hyperthyroidism with thyroid levels increasing above normal in 6/19 She was asymptomatic and only had a high T3 level  Currently taking 5 mg methimazole once daily since 1/20, previously her free T4 was low normal  She is still doing fairly well subjectively and does not complain of fatigue, unusual tiredness, weakness Does not have any heat or cold intolerance She thinks her weight is unchanged  She takes her methimazole 1 tablet daily regularly  Labs show her TSH high normal compared to previous levels Free T4 is slightly lower and free T3 is stable  Thyrotropin receptor antibody has gone up slightly from December level  Weight not available:   Wt Readings from Last 3 Encounters:  10/27/18 168 lb (76.2 kg)  09/14/18 170 lb 12.8 oz (77.5 kg)  07/31/18 169 lb (76.7 kg)      Thyroid function tests as follows:  Lab Results  Component Value Date   FREET4 0.80 12/27/2018   FREET4 0.95 10/25/2018   FREET4 0.69 09/11/2018   T3FREE 3.7 12/27/2018   T3FREE 3.4 10/25/2018   T3FREE 3.5 09/11/2018   TSH 4.08 12/27/2018   TSH 0.73 10/25/2018   TSH 1.92 09/11/2018    Lab Results  Component Value Date   THYROTRECAB 14.00 (H) 12/27/2018   THYROTRECAB 13.30 (H) 09/11/2018   THYROTRECAB 17.00 (H) 04/14/2018    BACKGROUND information:  At the age of 68 patient apparently had hyperthyroidism diagnosed when she had a prominent thyroid in the front of her neck.  She also thinks she may have had weight loss, palpitations, jitteriness at that time  She was treated with radioactive iodine treatment and subsequently was started on thyroid was normal   Allergies as of 12/29/2018      Reactions   Lotrel [amlodipine Besy-benazepril Hcl] Swelling, Rash      Medication List       Accurate as of December 29, 2018  8:24 AM. Always use your most recent med  list.        acetaminophen 325 MG tablet Commonly known as:  TYLENOL Take 1-2 tablets (325-650 mg total) by mouth every 4 (four) hours as needed for mild pain.   amLODipine 10 MG tablet Commonly known as:  NORVASC Take 10 mg by mouth daily.   cholecalciferol 1000 units tablet Commonly known as:  VITAMIN D Take 1,000 Units by mouth daily.   loperamide 2 MG capsule Commonly known as:  IMODIUM Take 6 mg by mouth 4 (four) times daily as needed for diarrhea or loose stools.   methimazole 5 MG tablet Commonly known as:  TAPAZOLE Take 5 mg by mouth daily. Take 1 tablet by mouth once daily.   tiZANidine 4 MG tablet Commonly known as:  ZANAFLEX TAKE 1 TABLET BY MOUTH EVERY 6 HOURS IF NEEDED FOR MUSCLE SPASM           Past Medical History:  Diagnosis Date  . Hypertension   . Symptomatic bradycardia 03/16/2017  . Thyroid goiter    had radioactive iodine for treatment    Past Surgical History:  Procedure Laterality Date  . CHOLECYSTECTOMY    . LEAD REVISION/REPAIR N/A 05/02/2017   Procedure: Lead Revision/Repair;  Surgeon: Regan Lemmingamnitz, Will Martin, MD;  Location: [facility];  Service: Cardiovascular;  Laterality: N/A;  . PACEMAKER IMPLANT N/A 03/17/2017   Procedure: Pacemaker Implant;  Surgeon: Regan Lemmingamnitz, Will Martin, MD;  Location: [facility];  Service: Cardiovascular;  Laterality: N/A;  . RECTAL PROLAPSE REPAIR  2016  . TONSILLECTOMY    . TUBAL LIGATION      Family History  Problem Relation Age of Onset  . Renal Disease Mother   . Diabetes Mother   . COPD Father   . Hypertension Father   . Arthritis Sister   . Rheum arthritis Sister   . Diabetes Maternal Grandmother   . Glaucoma Sister   . Hypertension Sister   . Multiple sclerosis Sister   . Thyroid disease Neg Hx     Social History:  reports that she has never smoked. She has never used smokeless tobacco. She reports that she does not drink alcohol or use drugs.  Allergies:  Allergies  Allergen  Reactions  . Lotrel [Amlodipine Besy-Benazepril Hcl] Swelling and Rash     Review of Systems  Hypertension followed by PCP   Examination:   There were no vitals taken for this  visit.  No examination done   Assessment/Plan:   Hyperthyroidism, secondary from Graves' disease, prior treatment with I-131 several years ago  She has had minimal symptoms baseline with her hyperthyroidism, mostly fatigue  Currently is on 5 mg methimazole since 09/2018 With this she is doing fairly well with no recurrence of fatigue  However her TSH is now upper normal compared to previous levels with only mild change in the free T4 and T3 levels  Discussed that we should be able to reduce methimazole to every other day to prevent hypothyroidism although unlikely that she can stop the medication because of her high thyrotropin receptor antibody Explained the meaning of the antibody levels She will follow-up in 2 months Continue follow-up with PCP for hypertension  Total visit time 5 minutes   There are no Patient Instructions on file for this visit.  Reather Littler 12/29/2018, 8:24 AM    Note: This office note was prepared with Dragon voice recognition system technology. Any transcriptional errors that result from this process are unintentional.

## 2019-01-25 ENCOUNTER — Other Ambulatory Visit: Payer: Self-pay | Admitting: Endocrinology

## 2019-01-30 ENCOUNTER — Other Ambulatory Visit: Payer: Self-pay

## 2019-01-30 ENCOUNTER — Ambulatory Visit (INDEPENDENT_AMBULATORY_CARE_PROVIDER_SITE_OTHER): Payer: Medicare PPO | Admitting: *Deleted

## 2019-01-30 DIAGNOSIS — I495 Sick sinus syndrome: Secondary | ICD-10-CM | POA: Diagnosis not present

## 2019-01-31 ENCOUNTER — Telehealth: Payer: Self-pay

## 2019-01-31 LAB — CUP PACEART REMOTE DEVICE CHECK
Battery Remaining Longevity: 163 mo
Battery Voltage: 3.04 V
Brady Statistic AP VP Percent: 0.02 %
Brady Statistic AP VS Percent: 3.93 %
Brady Statistic AS VP Percent: 0.04 %
Brady Statistic AS VS Percent: 96.02 %
Brady Statistic RA Percent Paced: 4.2 %
Brady Statistic RV Percent Paced: 0.06 %
Date Time Interrogation Session: 20200520112558
Implantable Lead Implant Date: 20180705
Implantable Lead Implant Date: 20180705
Implantable Lead Location: 753859
Implantable Lead Location: 753860
Implantable Lead Model: 5076
Implantable Lead Model: 5076
Implantable Pulse Generator Implant Date: 20180705
Lead Channel Impedance Value: 342 Ohm
Lead Channel Impedance Value: 399 Ohm
Lead Channel Impedance Value: 437 Ohm
Lead Channel Impedance Value: 494 Ohm
Lead Channel Pacing Threshold Amplitude: 0.75 V
Lead Channel Pacing Threshold Amplitude: 0.875 V
Lead Channel Pacing Threshold Pulse Width: 0.4 ms
Lead Channel Pacing Threshold Pulse Width: 0.4 ms
Lead Channel Sensing Intrinsic Amplitude: 2 mV
Lead Channel Sensing Intrinsic Amplitude: 2 mV
Lead Channel Sensing Intrinsic Amplitude: 8.375 mV
Lead Channel Sensing Intrinsic Amplitude: 8.375 mV
Lead Channel Setting Pacing Amplitude: 2 V
Lead Channel Setting Pacing Amplitude: 2.5 V
Lead Channel Setting Pacing Pulse Width: 0.4 ms
Lead Channel Setting Sensing Sensitivity: 2.8 mV

## 2019-01-31 NOTE — Telephone Encounter (Signed)
Spoke with patient to remind of missed remote transmission 

## 2019-02-08 ENCOUNTER — Encounter: Payer: Self-pay | Admitting: Cardiology

## 2019-02-08 NOTE — Progress Notes (Signed)
Remote pacemaker transmission.   

## 2019-02-19 ENCOUNTER — Other Ambulatory Visit (INDEPENDENT_AMBULATORY_CARE_PROVIDER_SITE_OTHER): Payer: Medicare PPO

## 2019-02-19 ENCOUNTER — Other Ambulatory Visit: Payer: Self-pay

## 2019-02-19 DIAGNOSIS — E0590 Thyrotoxicosis, unspecified without thyrotoxic crisis or storm: Secondary | ICD-10-CM | POA: Diagnosis not present

## 2019-02-19 LAB — T3, FREE: T3, Free: 3.2 pg/mL (ref 2.3–4.2)

## 2019-02-19 LAB — T4, FREE: Free T4: 0.8 ng/dL (ref 0.60–1.60)

## 2019-02-19 LAB — TSH: TSH: 0.92 u[IU]/mL (ref 0.35–4.50)

## 2019-02-21 ENCOUNTER — Ambulatory Visit (INDEPENDENT_AMBULATORY_CARE_PROVIDER_SITE_OTHER): Payer: Medicare PPO | Admitting: Endocrinology

## 2019-02-21 ENCOUNTER — Other Ambulatory Visit: Payer: Self-pay

## 2019-02-21 ENCOUNTER — Encounter: Payer: Self-pay | Admitting: Endocrinology

## 2019-02-21 DIAGNOSIS — E0590 Thyrotoxicosis, unspecified without thyrotoxic crisis or storm: Secondary | ICD-10-CM

## 2019-02-21 NOTE — Progress Notes (Signed)
Patient ID: Anna SchwabMichelle C Gutierrez, female   DOB: March 17, 1951, 68 y.o.   MRN: [id number]                                                                                                              Reason for Appointment:  Hyperthyroidism, follow-up   Today's office visit was provided via telemedicine using audio phone call since video methodology was not successful  Explained to the patient and the the limitations of evaluation and management by telemedicine and the availability of in person appointments.  The patient understood the limitations and agreed to proceed. Patient also understood that the telehealth visit is billable. . Location of the patient: Home . Location of the provider: Office Only the patient and myself were participating in the encounter    Chief complaint: Follow-up of thyroid   History of Present Illness:    On her initial consultation she was complaining that for several months she does not think she has as much stamina and is not able to walk as much as she can because of fatigue.  Did not have any symptoms of palpitations, shakiness, feeling excessively warm and sweaty, nervousness or weight loss She does not think she has any muscle weakness with going up stairs or getting up from squatting She has not had any change in her bowel habits or appetite Patient had a screening thyroid test done when she was admitted in July for symptomatic bradycardia and was found to be hyperthyroid She was discharged on 5 mg methimazole but she did not feel any different with this Her labs done by PCP in August 2018 showed a high normal total T4 and mildly increased T3 level of 212 with low TSH  She had not been on medications on her initial consultation in 9/18 Despite her lack of typical symptoms she had a significantly higher T3 level of 7.3 and free T4 for about 2.0 at baseline Thyrotropin receptor antibody was 34 She has been taking 10 mg of methimazole since 06/09/17  RECENT  history:  Her dose of methimazole has been changed periodically since 12/18  She had a relapse of her hyperthyroidism with thyroid levels increasing above normal in 6/19 She was asymptomatic and only had a high T3 level  Currently taking 5 mg methimazole every other day since her last visit in 12/2018 Previously on 5 mg once a day since 1/20  Recently does not have any symptoms of fatigue, weakness, palpitations or heat intolerance Weight has not changed She remembers to take her methimazole as directed  Labs show her TSH more normal compared to previous level Free T4 is unchanged and free T3 slightly lower compared to her last visit  Thyrotropin receptor antibody has been persistently high  Weight not available:   Wt Readings from Last 3 Encounters:  10/27/18 168 lb (76.2 kg)  09/14/18 170 lb 12.8 oz (77.5 kg)  07/31/18 169 lb (76.7 kg)      Thyroid function tests as follows:     Lab Results  Component Value  Date   FREET4 0.80 02/19/2019   FREET4 0.80 12/27/2018   FREET4 0.95 10/25/2018   T3FREE 3.2 02/19/2019   T3FREE 3.7 12/27/2018   T3FREE 3.4 10/25/2018   TSH 0.92 02/19/2019   TSH 4.08 12/27/2018   TSH 0.73 10/25/2018    Lab Results  Component Value Date   THYROTRECAB 14.00 (H) 12/27/2018   THYROTRECAB 13.30 (H) 09/11/2018   THYROTRECAB 17.00 (H) 04/14/2018    BACKGROUND information:  At the age of 23 patient apparently had hyperthyroidism diagnosed when she had a prominent thyroid in the front of her neck.  She also thinks she may have had weight loss, palpitations, jitteriness at that time  She was treated with radioactive iodine treatment and subsequently was started on thyroid was normal   Allergies as of 02/21/2019      Reactions   Lotrel [amlodipine Besy-benazepril Hcl] Swelling, Rash      Medication List       Accurate as of February 21, 2019  8:35 AM. If you have any questions, ask your nurse or doctor.        acetaminophen 325 MG tablet  Commonly known as:  TYLENOL Take 1-2 tablets (325-650 mg total) by mouth every 4 (four) hours as needed for mild pain.   amLODipine 10 MG tablet Commonly known as:  NORVASC Take 10 mg by mouth daily.   cholecalciferol 1000 units tablet Commonly known as:  VITAMIN D Take 1,000 Units by mouth daily.   loperamide 2 MG capsule Commonly known as:  IMODIUM Take 6 mg by mouth 4 (four) times daily as needed for diarrhea or loose stools.   methimazole 5 MG tablet Commonly known as:  TAPAZOLE TAKE 1 TABLET(5 MG) BY MOUTH TWICE DAILY What changed:  See the new instructions.   tiZANidine 4 MG tablet Commonly known as:  ZANAFLEX TAKE 1 TABLET BY MOUTH EVERY 6 HOURS IF NEEDED FOR MUSCLE SPASM What changed:  See the new instructions.           Past Medical History:  Diagnosis Date  . Hypertension   . Symptomatic bradycardia 03/16/2017  . Thyroid goiter    had radioactive iodine for treatment    Past Surgical History:  Procedure Laterality Date  . CHOLECYSTECTOMY    . LEAD REVISION/REPAIR N/A 05/02/2017   Procedure: Lead Revision/Repair;  Surgeon: Constance Haw, MD;  Location: [facility];  Service: Cardiovascular;  Laterality: N/A;  . PACEMAKER IMPLANT N/A 03/17/2017   Procedure: Pacemaker Implant;  Surgeon: Constance Haw, MD;  Location: [facility];  Service: Cardiovascular;  Laterality: N/A;  . RECTAL PROLAPSE REPAIR  2016  . TONSILLECTOMY    . TUBAL LIGATION      Family History  Problem Relation Age of Onset  . Renal Disease Mother   . Diabetes Mother   . COPD Father   . Hypertension Father   . Arthritis Sister   . Rheum arthritis Sister   . Diabetes Maternal Grandmother   . Glaucoma Sister   . Hypertension Sister   . Multiple sclerosis Sister   . Thyroid disease Neg Hx     Social History:  reports that she has never smoked. She has never used smokeless tobacco. She reports that she does not drink alcohol or use drugs.  Allergies:   Allergies  Allergen Reactions  . Lotrel [Amlodipine Besy-Benazepril Hcl] Swelling and Rash     Review of Systems  Hypertension followed by PCP   Examination:  There were no vitals taken for this visit.     Assessment/Plan:   Hyperthyroidism, secondary from Graves' disease, prior treatment with I-131 several years ago  She has had minimal symptoms baseline with her hyperthyroidism, mostly fatigue  This year she has been needing less methimazole and more recently only 5 mg every other day for at least 6 to 7 weeks Subjectively she is doing well now  Even with her thyrotropin receptor antibody being high she appears to be needing minimal medications for control TSH is quite normal although lower than before  Since her hyperthyroidism is quite stable she can try stopping her low-dose methimazole now Recheck in 6-week She will call if she has any recurrence of her hyperthyroidism symptoms that she had before  Total telephone visit time 6 minutes   There are no Patient Instructions on file for this visit.  Anna LittlerAjay Deziray Nabi 02/21/2019, 8:35 AM    Note: This office note was prepared with Dragon voice recognition system technology. Any transcriptional errors that result from this process are unintentional.

## 2019-04-10 ENCOUNTER — Other Ambulatory Visit: Payer: Self-pay

## 2019-04-10 ENCOUNTER — Other Ambulatory Visit (INDEPENDENT_AMBULATORY_CARE_PROVIDER_SITE_OTHER): Payer: Medicare PPO

## 2019-04-10 DIAGNOSIS — E0590 Thyrotoxicosis, unspecified without thyrotoxic crisis or storm: Secondary | ICD-10-CM | POA: Diagnosis not present

## 2019-04-10 LAB — T4, FREE: Free T4: 0.93 ng/dL (ref 0.60–1.60)

## 2019-04-10 LAB — T3, FREE: T3, Free: 3.4 pg/mL (ref 2.3–4.2)

## 2019-04-10 LAB — TSH: TSH: 0.24 u[IU]/mL — ABNORMAL LOW (ref 0.35–4.50)

## 2019-04-13 ENCOUNTER — Ambulatory Visit (INDEPENDENT_AMBULATORY_CARE_PROVIDER_SITE_OTHER): Payer: Medicare PPO | Admitting: Endocrinology

## 2019-04-13 ENCOUNTER — Encounter: Payer: Self-pay | Admitting: Endocrinology

## 2019-04-13 ENCOUNTER — Other Ambulatory Visit: Payer: Self-pay

## 2019-04-13 DIAGNOSIS — E0590 Thyrotoxicosis, unspecified without thyrotoxic crisis or storm: Secondary | ICD-10-CM

## 2019-04-13 NOTE — Progress Notes (Signed)
Patient ID: Anna Gutierrez, female   DOB: 03-10-1951, 68 y.o.   MRN: [id number]                                                                                                              Reason for Appointment:  Hyperthyroidism, follow-up   Today's office visit was provided via telemedicine using video technique The patient was explained the limitations of evaluation and management by telemedicine and the availability of in person appointments.  The patient understood the limitations and agreed to proceed. Patient also understood that the telehealth visit is billable. . Location of the patient: Patient's home . Location of the provider: Physician office Only the patient and myself were participating in the encounter      Chief complaint: Follow-up of thyroid   History of Present Illness:    On her initial consultation she was complaining that for several months she does not think she has as much stamina and is not able to walk as much as she can because of fatigue.  Did not have any symptoms of palpitations, shakiness, feeling excessively warm and sweaty, nervousness or weight loss She does not think she has any muscle weakness with going up stairs or getting up from squatting She has not had any change in her bowel habits or appetite Patient had a screening thyroid test done when she was admitted in July for symptomatic bradycardia and was found to be hyperthyroid She was discharged on 5 mg methimazole but she did not feel any different with this Her labs done by PCP in August 2018 showed a high normal total T4 and mildly increased T3 level of 212 with low TSH  She had not been on medications on her initial consultation in 9/18 Despite her lack of typical symptoms she had a significantly higher T3 level of 7.3 and free T4 for about 2.0 at baseline Thyrotropin receptor antibody was 34 She has been taking 10 mg of methimazole since 06/09/17  RECENT history:  Her dose of  methimazole has been changed periodically since 12/18  She had a relapse of her hyperthyroidism with thyroid levels increasing above normal in 6/19 She was asymptomatic and only had a high T3 level  She was taking 5 mg methimazole every other day since 12/2018 and this was stopped in 02/2019  So far has had no recurrence of any fatigue, weakness which were her baseline symptoms for hyperthyroidism She does not think her weight has changed  Labs show her TSH slightly below normal at 0.24 However free T4 and T3 are normal with minimal relative increase compared to previous  Thyrotropin receptor antibody has been persistently high as of 4/20  Weight not available:   Wt Readings from Last 3 Encounters:  10/27/18 168 lb (76.2 kg)  09/14/18 170 lb 12.8 oz (77.5 kg)  07/31/18 169 lb (76.7 kg)      Thyroid function tests as follows:     Lab Results  Component Value Date   FREET4 0.93 04/10/2019   FREET4 0.80  02/19/2019   FREET4 0.80 12/27/2018   T3FREE 3.4 04/10/2019   T3FREE 3.2 02/19/2019   T3FREE 3.7 12/27/2018   TSH 0.24 (L) 04/10/2019   TSH 0.92 02/19/2019   TSH 4.08 12/27/2018    Lab Results  Component Value Date   THYROTRECAB 14.00 (H) 12/27/2018   THYROTRECAB 13.30 (H) 09/11/2018   THYROTRECAB 17.00 (H) 04/14/2018    BACKGROUND information:  At the age of 68 patient apparently had hyperthyroidism diagnosed when she had a prominent thyroid in the front of her neck.  She also thinks she may have had weight loss, palpitations, jitteriness at that time  She was treated with radioactive iodine treatment and subsequently was started on thyroid was normal   Allergies as of 04/13/2019      Reactions   Lotrel [amlodipine Besy-benazepril Hcl] Swelling, Rash      Medication List       Accurate as of April 13, 2019  8:34 AM. If you have any questions, ask your nurse or doctor.        STOP taking these medications   methimazole 5 MG tablet Commonly known as:  TAPAZOLE Stopped by: Reather LittlerAjay Orli Degrave, MD     TAKE these medications   acetaminophen 325 MG tablet Commonly known as: TYLENOL Take 1-2 tablets (325-650 mg total) by mouth every 4 (four) hours as needed for mild pain.   amLODipine 10 MG tablet Commonly known as: NORVASC Take 10 mg by mouth daily.   cholecalciferol 1000 units tablet Commonly known as: VITAMIN D Take 1,000 Units by mouth daily.   loperamide 2 MG capsule Commonly known as: IMODIUM Take 6 mg by mouth 4 (four) times daily as needed for diarrhea or loose stools.   tiZANidine 4 MG tablet Commonly known as: ZANAFLEX TAKE 1 TABLET BY MOUTH EVERY 6 HOURS IF NEEDED FOR MUSCLE SPASM What changed: See the new instructions.           Past Medical History:  Diagnosis Date  . Hypertension   . Symptomatic bradycardia 03/16/2017  . Thyroid goiter    had radioactive iodine for treatment    Past Surgical History:  Procedure Laterality Date  . CHOLECYSTECTOMY    . LEAD REVISION/REPAIR N/A 05/02/2017   Procedure: Lead Revision/Repair;  Surgeon: Regan Lemmingamnitz, Will Martin, MD;  Location: [facility];  Service: Cardiovascular;  Laterality: N/A;  . PACEMAKER IMPLANT N/A 03/17/2017   Procedure: Pacemaker Implant;  Surgeon: Regan Lemmingamnitz, Will Martin, MD;  Location: [facility];  Service: Cardiovascular;  Laterality: N/A;  . RECTAL PROLAPSE REPAIR  2016  . TONSILLECTOMY    . TUBAL LIGATION      Family History  Problem Relation Age of Onset  . Renal Disease Mother   . Diabetes Mother   . COPD Father   . Hypertension Father   . Arthritis Sister   . Rheum arthritis Sister   . Diabetes Maternal Grandmother   . Glaucoma Sister   . Hypertension Sister   . Multiple sclerosis Sister   . Thyroid disease Neg Hx     Social History:  reports that she has never smoked. She has never used smokeless tobacco. She reports that she does not drink alcohol or use drugs.  Allergies:  Allergies  Allergen Reactions  . Lotrel [Amlodipine  Besy-Benazepril Hcl] Swelling and Rash     Review of Systems  Hypertension followed by PCP   Examination:   There were no vitals taken for this visit.     Assessment/Plan:  Hyperthyroidism, secondary from Graves' disease, prior treatment with I-131 several years ago  She has had nonspecific symptoms at the time of diagnosis of her hyperthyroidism, mostly fatigue and weakness  Her methimazole has been tapered off Subjectively she is doing well now without recurrence of fatigue or any heat intolerance Notice any rapid heart rate  Thyroid levels are minimally higher with TSH now being slightly low but not in the hyperthyroid range Free T4 and T3 are normal  Since she does not have clear-cut signs of recurrence of her hyperthyroidism and TSH is only 0.24 we will continue to watch without antithyroid drugs for now  She will call if she has any recurrence of her hyperthyroidism symptoms that she had before      There are no Patient Instructions on file for this visit.  Reather LittlerAjay Minnette Merida 04/13/2019, 8:34 AM    Note: This office note was prepared with Dragon voice recognition system technology. Any transcriptional errors that result from this process are unintentional.

## 2019-05-01 ENCOUNTER — Ambulatory Visit (INDEPENDENT_AMBULATORY_CARE_PROVIDER_SITE_OTHER): Payer: Medicare PPO | Admitting: *Deleted

## 2019-05-01 DIAGNOSIS — I495 Sick sinus syndrome: Secondary | ICD-10-CM

## 2019-05-02 ENCOUNTER — Telehealth: Payer: Self-pay

## 2019-05-02 LAB — CUP PACEART REMOTE DEVICE CHECK
Battery Remaining Longevity: 160 mo
Battery Voltage: 3.04 V
Brady Statistic AP VP Percent: 0.04 %
Brady Statistic AP VS Percent: 4.91 %
Brady Statistic AS VP Percent: 0.04 %
Brady Statistic AS VS Percent: 95.01 %
Brady Statistic RA Percent Paced: 5.23 %
Brady Statistic RV Percent Paced: 0.08 %
Date Time Interrogation Session: 20200819155110
Implantable Lead Implant Date: 20180705
Implantable Lead Implant Date: 20180705
Implantable Lead Location: 753859
Implantable Lead Location: 753860
Implantable Lead Model: 5076
Implantable Lead Model: 5076
Implantable Pulse Generator Implant Date: 20180705
Lead Channel Impedance Value: 380 Ohm
Lead Channel Impedance Value: 399 Ohm
Lead Channel Impedance Value: 437 Ohm
Lead Channel Impedance Value: 513 Ohm
Lead Channel Pacing Threshold Amplitude: 0.625 V
Lead Channel Pacing Threshold Amplitude: 1 V
Lead Channel Pacing Threshold Pulse Width: 0.4 ms
Lead Channel Pacing Threshold Pulse Width: 0.4 ms
Lead Channel Sensing Intrinsic Amplitude: 2.875 mV
Lead Channel Sensing Intrinsic Amplitude: 2.875 mV
Lead Channel Sensing Intrinsic Amplitude: 8.125 mV
Lead Channel Sensing Intrinsic Amplitude: 8.125 mV
Lead Channel Setting Pacing Amplitude: 2 V
Lead Channel Setting Pacing Amplitude: 2.5 V
Lead Channel Setting Pacing Pulse Width: 0.4 ms
Lead Channel Setting Sensing Sensitivity: 2.8 mV

## 2019-05-02 NOTE — Telephone Encounter (Signed)
Left message for patient to remind of missed remote transmission.  

## 2019-05-09 NOTE — Progress Notes (Signed)
Remote pacemaker transmission.   

## 2019-06-11 ENCOUNTER — Other Ambulatory Visit (INDEPENDENT_AMBULATORY_CARE_PROVIDER_SITE_OTHER): Payer: Medicare PPO

## 2019-06-11 ENCOUNTER — Other Ambulatory Visit: Payer: Self-pay

## 2019-06-11 DIAGNOSIS — E0590 Thyrotoxicosis, unspecified without thyrotoxic crisis or storm: Secondary | ICD-10-CM

## 2019-06-11 LAB — T3, FREE: T3, Free: 3.5 pg/mL (ref 2.3–4.2)

## 2019-06-11 LAB — TSH: TSH: 0.45 u[IU]/mL (ref 0.35–4.50)

## 2019-06-11 LAB — T4, FREE: Free T4: 0.89 ng/dL (ref 0.60–1.60)

## 2019-06-12 LAB — THYROTROPIN RECEPTOR AUTOABS: Thyrotropin Receptor Ab: 10.7 IU/L — ABNORMAL HIGH (ref 0.00–1.75)

## 2019-06-14 ENCOUNTER — Ambulatory Visit (INDEPENDENT_AMBULATORY_CARE_PROVIDER_SITE_OTHER): Payer: Medicare PPO | Admitting: Endocrinology

## 2019-06-14 ENCOUNTER — Encounter: Payer: Self-pay | Admitting: Endocrinology

## 2019-06-14 ENCOUNTER — Other Ambulatory Visit: Payer: Self-pay

## 2019-06-14 VITALS — BP 125/76 | HR 77 | Temp 97.4°F | Wt 169.0 lb

## 2019-06-14 DIAGNOSIS — Z8639 Personal history of other endocrine, nutritional and metabolic disease: Secondary | ICD-10-CM

## 2019-06-14 NOTE — Progress Notes (Signed)
Patient ID: Anna SchwabMichelle C Fussell, female   DOB: March 12, 1951, 68 y.o.   MRN: [id number]                                                                                                              Reason for Appointment:  Hyperthyroidism, follow-up   Today's office visit was provided via telemedicine using video technique The patient was explained the limitations of evaluation and management by telemedicine and the availability of in person appointments.  The patient understood the limitations and agreed to proceed. Patient also understood that the telehealth visit is billable. . Location of the patient: Patient's home . Location of the provider: Physician office Only the patient and myself were participating in the encounter    Chief complaint: Follow-up of thyroid   History of Present Illness:    On her initial consultation she was complaining that for several months she does not think she has as much stamina and is not able to walk as much as she can because of fatigue.  Did not have any symptoms of palpitations, shakiness, feeling excessively warm and sweaty, nervousness or weight loss She does not think she has any muscle weakness with going up stairs or getting up from squatting She has not had any change in her bowel habits or appetite Patient had a screening thyroid test done when she was admitted in July for symptomatic bradycardia and was found to be hyperthyroid She was discharged on 5 mg methimazole but she did not feel any different with this Her labs done by PCP in August 2018 showed a high normal total T4 and mildly increased T3 level of 212 with low TSH  She had not been on medications on her initial consultation in 9/18 Despite her lack of typical symptoms she had a significantly higher T3 level of 7.3 and free T4 for about 2.0 at baseline Thyrotropin receptor antibody was 34 She has been taking 10 mg of methimazole since 06/09/17  RECENT history:  Her dose of  methimazole has been changed periodically since 12/18  She had a relapse of her hyperthyroidism with thyroid levels increasing above normal in 6/19 She was asymptomatic and only had a high T3 level  She was taking 5 mg methimazole every other day since 12/2018  Methimazole was was stopped in 02/2019  Again has no recurrence of symptoms of fatigue, weakness which were her baseline symptoms for hyperthyroidism Also no palpitations or shakiness She does not think her weight has changed  Labs show her TSH which was slightly below normal at 0.24 is slightly improved at 0.45 Her free T4 and T3 are normal and about the same   Thyrotropin receptor antibody has been persistently high and is still not improved to normal  Weight not available:   Wt Readings from Last 3 Encounters:  06/14/19 169 lb (76.7 kg)  10/27/18 168 lb (76.2 kg)  09/14/18 170 lb 12.8 oz (77.5 kg)      Thyroid function tests as follows:     Lab Results  Component Value Date   FREET4 0.89 06/11/2019   FREET4 0.93 04/10/2019   FREET4 0.80 02/19/2019   T3FREE 3.5 06/11/2019   T3FREE 3.4 04/10/2019   T3FREE 3.2 02/19/2019   TSH 0.45 06/11/2019   TSH 0.24 (L) 04/10/2019   TSH 0.92 02/19/2019    Lab Results  Component Value Date   THYROTRECAB 10.70 (H) 06/11/2019   THYROTRECAB 14.00 (H) 12/27/2018   THYROTRECAB 13.30 (H) 09/11/2018    BACKGROUND information:  At the age of 85 patient apparently had hyperthyroidism diagnosed when she had a prominent thyroid in the front of her neck.  She also thinks she may have had weight loss, palpitations, jitteriness at that time  She was treated with radioactive iodine treatment and subsequently was started on thyroid was normal   Allergies as of 06/14/2019      Reactions   Lotrel [amlodipine Besy-benazepril Hcl] Swelling, Rash      Medication List       Accurate as of June 14, 2019 10:49 AM. If you have any questions, ask your nurse or doctor.         acetaminophen 325 MG tablet Commonly known as: TYLENOL Take 1-2 tablets (325-650 mg total) by mouth every 4 (four) hours as needed for mild pain.   amLODipine 10 MG tablet Commonly known as: NORVASC Take 10 mg by mouth daily.   cholecalciferol 1000 units tablet Commonly known as: VITAMIN D Take 1,000 Units by mouth daily.   loperamide 2 MG capsule Commonly known as: IMODIUM Take 6 mg by mouth 4 (four) times daily as needed for diarrhea or loose stools.   tiZANidine 4 MG tablet Commonly known as: ZANAFLEX TAKE 1 TABLET BY MOUTH EVERY 6 HOURS IF NEEDED FOR MUSCLE SPASM What changed: See the new instructions.           Past Medical History:  Diagnosis Date  . Hypertension   . Symptomatic bradycardia 03/16/2017  . Thyroid goiter    had radioactive iodine for treatment    Past Surgical History:  Procedure Laterality Date  . CHOLECYSTECTOMY    . LEAD REVISION/REPAIR N/A 05/02/2017   Procedure: Lead Revision/Repair;  Surgeon: Constance Haw, MD;  Location: [facility];  Service: Cardiovascular;  Laterality: N/A;  . PACEMAKER IMPLANT N/A 03/17/2017   Procedure: Pacemaker Implant;  Surgeon: Constance Haw, MD;  Location: [facility];  Service: Cardiovascular;  Laterality: N/A;  . RECTAL PROLAPSE REPAIR  2016  . TONSILLECTOMY    . TUBAL LIGATION      Family History  Problem Relation Age of Onset  . Renal Disease Mother   . Diabetes Mother   . COPD Father   . Hypertension Father   . Arthritis Sister   . Rheum arthritis Sister   . Diabetes Maternal Grandmother   . Glaucoma Sister   . Hypertension Sister   . Multiple sclerosis Sister   . Thyroid disease Neg Hx     Social History:  reports that she has never smoked. She has never used smokeless tobacco. She reports that she does not drink alcohol or use drugs.  Allergies:  Allergies  Allergen Reactions  . Lotrel [Amlodipine Besy-Benazepril Hcl] Swelling and Rash     Review of Systems   Hypertension followed by PCP   Examination:   BP 125/76   Pulse 77   Temp (!) 97.4 F (36.3 C)   Wt 169 lb (76.7 kg)   SpO2 99%   BMI 28.12 kg/m  Assessment/Plan:   Hyperthyroidism, secondary from Graves' disease, prior treatment with I-131 several years ago  She has had nonspecific symptoms at the time of diagnosis of her hyperthyroidism, mostly fatigue and weakness  Her methimazole has been stopped about 4 months ago  Subjectively she is not having any recurrence of hyperthyroid symptoms especially with fatigue and weakness  She did not have a goiter on her previous exams  Thyroid levels are quite normal although TSH is still low normal This was 0.24 on the previous visit Although she does appear to be having a chance of recurrence because of high thyrotropin receptor antibody persisting so far she has not had any recurrence even with about 4 months of no treatment  We will continue observation for now  She will call if she has any recurrence of her hyperthyroidism symptoms such as fatigue or weakness Otherwise she can have annual labs done with her PCP     There are no Patient Instructions on file for this visit.  Reather Littler 06/14/2019, 10:49 AM    Note: This office note was prepared with Dragon voice recognition system technology. Any transcriptional errors that result from this process are unintentional.

## 2019-06-22 ENCOUNTER — Ambulatory Visit
Admission: RE | Admit: 2019-06-22 | Discharge: 2019-06-22 | Disposition: A | Discharge status: Home / Self Care | Payer: Medicare PPO | Source: Ambulatory Visit | Attending: Internal Medicine | Admitting: Internal Medicine

## 2019-06-22 ENCOUNTER — Other Ambulatory Visit: Payer: Self-pay

## 2019-06-22 DIAGNOSIS — Z1231 Encounter for screening mammogram for malignant neoplasm of breast: Secondary | ICD-10-CM

## 2019-07-31 ENCOUNTER — Encounter: Payer: Medicare PPO | Admitting: *Deleted

## 2019-08-02 ENCOUNTER — Telehealth: Payer: Self-pay

## 2019-08-02 NOTE — Telephone Encounter (Signed)
Left message for patient to remind of missed remote transmission.  

## 2019-10-30 ENCOUNTER — Ambulatory Visit (INDEPENDENT_AMBULATORY_CARE_PROVIDER_SITE_OTHER): Payer: Medicare PPO | Admitting: *Deleted

## 2019-10-30 DIAGNOSIS — I495 Sick sinus syndrome: Secondary | ICD-10-CM | POA: Diagnosis not present

## 2019-10-30 LAB — CUP PACEART REMOTE DEVICE CHECK
Battery Remaining Longevity: 154 mo
Battery Voltage: 3.03 V
Brady Statistic AP VP Percent: 0.03 %
Brady Statistic AP VS Percent: 8 %
Brady Statistic AS VP Percent: 0.04 %
Brady Statistic AS VS Percent: 91.93 %
Brady Statistic RA Percent Paced: 8.38 %
Brady Statistic RV Percent Paced: 0.07 %
Date Time Interrogation Session: 20210215235036
Implantable Lead Implant Date: 20180705
Implantable Lead Implant Date: 20180705
Implantable Lead Location: 753859
Implantable Lead Location: 753860
Implantable Lead Model: 5076
Implantable Lead Model: 5076
Implantable Pulse Generator Implant Date: 20180705
Lead Channel Impedance Value: 342 Ohm
Lead Channel Impedance Value: 399 Ohm
Lead Channel Impedance Value: 437 Ohm
Lead Channel Impedance Value: 494 Ohm
Lead Channel Pacing Threshold Amplitude: 0.75 V
Lead Channel Pacing Threshold Amplitude: 1 V
Lead Channel Pacing Threshold Pulse Width: 0.4 ms
Lead Channel Pacing Threshold Pulse Width: 0.4 ms
Lead Channel Sensing Intrinsic Amplitude: 2.125 mV
Lead Channel Sensing Intrinsic Amplitude: 2.125 mV
Lead Channel Sensing Intrinsic Amplitude: 7.5 mV
Lead Channel Sensing Intrinsic Amplitude: 7.5 mV
Lead Channel Setting Pacing Amplitude: 2 V
Lead Channel Setting Pacing Amplitude: 2.5 V
Lead Channel Setting Pacing Pulse Width: 0.4 ms
Lead Channel Setting Sensing Sensitivity: 2.8 mV

## 2019-10-31 NOTE — Progress Notes (Signed)
PPM Remote  

## 2020-01-04 ENCOUNTER — Encounter: Payer: Self-pay | Admitting: Endocrinology

## 2020-01-29 ENCOUNTER — Ambulatory Visit (INDEPENDENT_AMBULATORY_CARE_PROVIDER_SITE_OTHER): Payer: Medicare PPO | Admitting: *Deleted

## 2020-01-29 DIAGNOSIS — I495 Sick sinus syndrome: Secondary | ICD-10-CM | POA: Diagnosis not present

## 2020-01-30 ENCOUNTER — Telehealth: Payer: Self-pay

## 2020-01-30 LAB — CUP PACEART REMOTE DEVICE CHECK
Battery Remaining Longevity: 151 mo
Battery Voltage: 3.03 V
Brady Statistic AP VP Percent: 0.03 %
Brady Statistic AP VS Percent: 5.84 %
Brady Statistic AS VP Percent: 0.04 %
Brady Statistic AS VS Percent: 94.1 %
Brady Statistic RA Percent Paced: 6.21 %
Brady Statistic RV Percent Paced: 0.06 %
Date Time Interrogation Session: 20210519103453
Implantable Lead Implant Date: 20180705
Implantable Lead Implant Date: 20180705
Implantable Lead Location: 753859
Implantable Lead Location: 753860
Implantable Lead Model: 5076
Implantable Lead Model: 5076
Implantable Pulse Generator Implant Date: 20180705
Lead Channel Impedance Value: 361 Ohm
Lead Channel Impedance Value: 399 Ohm
Lead Channel Impedance Value: 437 Ohm
Lead Channel Impedance Value: 513 Ohm
Lead Channel Pacing Threshold Amplitude: 0.75 V
Lead Channel Pacing Threshold Amplitude: 0.875 V
Lead Channel Pacing Threshold Pulse Width: 0.4 ms
Lead Channel Pacing Threshold Pulse Width: 0.4 ms
Lead Channel Sensing Intrinsic Amplitude: 1.375 mV
Lead Channel Sensing Intrinsic Amplitude: 1.375 mV
Lead Channel Sensing Intrinsic Amplitude: 7.625 mV
Lead Channel Sensing Intrinsic Amplitude: 7.625 mV
Lead Channel Setting Pacing Amplitude: 2 V
Lead Channel Setting Pacing Amplitude: 2.5 V
Lead Channel Setting Pacing Pulse Width: 0.4 ms
Lead Channel Setting Sensing Sensitivity: 2.8 mV

## 2020-01-30 NOTE — Telephone Encounter (Signed)
Spoke with patient to remind of missed remote transmission 

## 2020-01-31 NOTE — Progress Notes (Signed)
Remote pacemaker transmission.   

## 2020-04-29 ENCOUNTER — Ambulatory Visit (INDEPENDENT_AMBULATORY_CARE_PROVIDER_SITE_OTHER): Payer: Medicare PPO | Admitting: *Deleted

## 2020-04-29 DIAGNOSIS — I495 Sick sinus syndrome: Secondary | ICD-10-CM | POA: Diagnosis not present

## 2020-05-01 LAB — CUP PACEART REMOTE DEVICE CHECK
Battery Remaining Longevity: 147 mo
Battery Voltage: 3.03 V
Brady Statistic AP VP Percent: 0.05 %
Brady Statistic AP VS Percent: 10.21 %
Brady Statistic AS VP Percent: 0.03 %
Brady Statistic AS VS Percent: 89.71 %
Brady Statistic RA Percent Paced: 10.56 %
Brady Statistic RV Percent Paced: 0.08 %
Date Time Interrogation Session: 20210818125441
Implantable Lead Implant Date: 20180705
Implantable Lead Implant Date: 20180705
Implantable Lead Location: 753859
Implantable Lead Location: 753860
Implantable Lead Model: 5076
Implantable Lead Model: 5076
Implantable Pulse Generator Implant Date: 20180705
Lead Channel Impedance Value: 361 Ohm
Lead Channel Impedance Value: 380 Ohm
Lead Channel Impedance Value: 418 Ohm
Lead Channel Impedance Value: 532 Ohm
Lead Channel Pacing Threshold Amplitude: 0.625 V
Lead Channel Pacing Threshold Amplitude: 0.875 V
Lead Channel Pacing Threshold Pulse Width: 0.4 ms
Lead Channel Pacing Threshold Pulse Width: 0.4 ms
Lead Channel Sensing Intrinsic Amplitude: 2.625 mV
Lead Channel Sensing Intrinsic Amplitude: 2.625 mV
Lead Channel Sensing Intrinsic Amplitude: 7.5 mV
Lead Channel Sensing Intrinsic Amplitude: 7.5 mV
Lead Channel Setting Pacing Amplitude: 2 V
Lead Channel Setting Pacing Amplitude: 2.5 V
Lead Channel Setting Pacing Pulse Width: 0.4 ms
Lead Channel Setting Sensing Sensitivity: 2.8 mV

## 2020-05-05 NOTE — Progress Notes (Signed)
Remote pacemaker transmission.   

## 2020-05-22 ENCOUNTER — Other Ambulatory Visit: Payer: Self-pay | Admitting: Internal Medicine

## 2020-05-26 ENCOUNTER — Other Ambulatory Visit: Payer: Self-pay | Admitting: Internal Medicine

## 2020-05-26 DIAGNOSIS — Z1231 Encounter for screening mammogram for malignant neoplasm of breast: Secondary | ICD-10-CM

## 2020-06-23 ENCOUNTER — Ambulatory Visit
Admission: RE | Admit: 2020-06-23 | Discharge: 2020-06-23 | Disposition: A | Discharge status: Home / Self Care | Payer: Medicare PPO | Source: Ambulatory Visit

## 2020-06-23 ENCOUNTER — Other Ambulatory Visit: Payer: Self-pay

## 2020-06-23 DIAGNOSIS — Z1231 Encounter for screening mammogram for malignant neoplasm of breast: Secondary | ICD-10-CM

## 2020-07-29 ENCOUNTER — Ambulatory Visit (INDEPENDENT_AMBULATORY_CARE_PROVIDER_SITE_OTHER): Payer: Medicare PPO

## 2020-07-29 DIAGNOSIS — I495 Sick sinus syndrome: Secondary | ICD-10-CM | POA: Diagnosis not present

## 2020-07-31 LAB — CUP PACEART REMOTE DEVICE CHECK
Battery Remaining Longevity: 144 mo
Battery Voltage: 3.02 V
Brady Statistic AP VP Percent: 0.03 %
Brady Statistic AP VS Percent: 11.54 %
Brady Statistic AS VP Percent: 0.03 %
Brady Statistic AS VS Percent: 88.4 %
Brady Statistic RA Percent Paced: 11.88 %
Brady Statistic RV Percent Paced: 0.06 %
Date Time Interrogation Session: 20211117135108
Implantable Lead Implant Date: 20180705
Implantable Lead Implant Date: 20180705
Implantable Lead Location: 753859
Implantable Lead Location: 753860
Implantable Lead Model: 5076
Implantable Lead Model: 5076
Implantable Pulse Generator Implant Date: 20180705
Lead Channel Impedance Value: 361 Ohm
Lead Channel Impedance Value: 380 Ohm
Lead Channel Impedance Value: 418 Ohm
Lead Channel Impedance Value: 494 Ohm
Lead Channel Pacing Threshold Amplitude: 0.625 V
Lead Channel Pacing Threshold Amplitude: 0.875 V
Lead Channel Pacing Threshold Pulse Width: 0.4 ms
Lead Channel Pacing Threshold Pulse Width: 0.4 ms
Lead Channel Sensing Intrinsic Amplitude: 3.125 mV
Lead Channel Sensing Intrinsic Amplitude: 3.125 mV
Lead Channel Sensing Intrinsic Amplitude: 6.875 mV
Lead Channel Sensing Intrinsic Amplitude: 6.875 mV
Lead Channel Setting Pacing Amplitude: 2 V
Lead Channel Setting Pacing Amplitude: 2.5 V
Lead Channel Setting Pacing Pulse Width: 0.4 ms
Lead Channel Setting Sensing Sensitivity: 2.8 mV

## 2020-07-31 NOTE — Progress Notes (Signed)
Remote pacemaker transmission.   

## 2020-10-31 ENCOUNTER — Ambulatory Visit: Payer: Medicare PPO

## 2020-11-04 LAB — CUP PACEART REMOTE DEVICE CHECK
Battery Remaining Longevity: 141 mo
Battery Voltage: 3.02 V
Brady Statistic AP VP Percent: 0.01 %
Brady Statistic AP VS Percent: 4.87 %
Brady Statistic AS VP Percent: 0.04 %
Brady Statistic AS VS Percent: 95.08 %
Brady Statistic RA Percent Paced: 5.14 %
Brady Statistic RV Percent Paced: 0.06 %
Date Time Interrogation Session: 20220221044558
Implantable Lead Implant Date: 20180705
Implantable Lead Implant Date: 20180705
Implantable Lead Location: 753859
Implantable Lead Location: 753860
Implantable Lead Model: 5076
Implantable Lead Model: 5076
Implantable Pulse Generator Implant Date: 20180705
Lead Channel Impedance Value: 380 Ohm
Lead Channel Impedance Value: 399 Ohm
Lead Channel Impedance Value: 437 Ohm
Lead Channel Impedance Value: 513 Ohm
Lead Channel Pacing Threshold Amplitude: 0.75 V
Lead Channel Pacing Threshold Amplitude: 0.875 V
Lead Channel Pacing Threshold Pulse Width: 0.4 ms
Lead Channel Pacing Threshold Pulse Width: 0.4 ms
Lead Channel Sensing Intrinsic Amplitude: 3.375 mV
Lead Channel Sensing Intrinsic Amplitude: 3.375 mV
Lead Channel Sensing Intrinsic Amplitude: 8 mV
Lead Channel Sensing Intrinsic Amplitude: 8 mV
Lead Channel Setting Pacing Amplitude: 2 V
Lead Channel Setting Pacing Amplitude: 2.5 V
Lead Channel Setting Pacing Pulse Width: 0.4 ms
Lead Channel Setting Sensing Sensitivity: 2.8 mV

## 2021-01-30 ENCOUNTER — Ambulatory Visit (INDEPENDENT_AMBULATORY_CARE_PROVIDER_SITE_OTHER): Payer: Medicare PPO

## 2021-01-30 DIAGNOSIS — I495 Sick sinus syndrome: Secondary | ICD-10-CM

## 2021-02-02 LAB — CUP PACEART REMOTE DEVICE CHECK
Battery Remaining Longevity: 138 mo
Battery Voltage: 3.02 V
Brady Statistic AP VP Percent: 0.04 %
Brady Statistic AP VS Percent: 7.66 %
Brady Statistic AS VP Percent: 0.04 %
Brady Statistic AS VS Percent: 92.26 %
Brady Statistic RA Percent Paced: 8.07 %
Brady Statistic RV Percent Paced: 0.07 %
Date Time Interrogation Session: 20220520132159
Implantable Lead Implant Date: 20180705
Implantable Lead Implant Date: 20180705
Implantable Lead Location: 753859
Implantable Lead Location: 753860
Implantable Lead Model: 5076
Implantable Lead Model: 5076
Implantable Pulse Generator Implant Date: 20180705
Lead Channel Impedance Value: 342 Ohm
Lead Channel Impedance Value: 380 Ohm
Lead Channel Impedance Value: 418 Ohm
Lead Channel Impedance Value: 475 Ohm
Lead Channel Pacing Threshold Amplitude: 0.75 V
Lead Channel Pacing Threshold Amplitude: 1 V
Lead Channel Pacing Threshold Pulse Width: 0.4 ms
Lead Channel Pacing Threshold Pulse Width: 0.4 ms
Lead Channel Sensing Intrinsic Amplitude: 1 mV
Lead Channel Sensing Intrinsic Amplitude: 1 mV
Lead Channel Sensing Intrinsic Amplitude: 8.375 mV
Lead Channel Sensing Intrinsic Amplitude: 8.375 mV
Lead Channel Setting Pacing Amplitude: 2 V
Lead Channel Setting Pacing Amplitude: 2.5 V
Lead Channel Setting Pacing Pulse Width: 0.4 ms
Lead Channel Setting Sensing Sensitivity: 2.8 mV

## 2021-02-03 ENCOUNTER — Encounter (HOSPITAL_COMMUNITY): Payer: Self-pay

## 2021-02-03 ENCOUNTER — Emergency Department (HOSPITAL_COMMUNITY)
Admission: EM | Admit: 2021-02-03 | Discharge: 2021-02-03 | Disposition: A | Discharge status: Home / Self Care | Payer: Medicare PPO | Attending: Emergency Medicine | Admitting: Emergency Medicine

## 2021-02-03 DIAGNOSIS — I10 Essential (primary) hypertension: Secondary | ICD-10-CM | POA: Insufficient documentation

## 2021-02-03 DIAGNOSIS — L299 Pruritus, unspecified: Secondary | ICD-10-CM | POA: Insufficient documentation

## 2021-02-03 DIAGNOSIS — Z950 Presence of cardiac pacemaker: Secondary | ICD-10-CM | POA: Diagnosis not present

## 2021-02-03 DIAGNOSIS — T7840XA Allergy, unspecified, initial encounter: Secondary | ICD-10-CM | POA: Diagnosis present

## 2021-02-03 DIAGNOSIS — Z79899 Other long term (current) drug therapy: Secondary | ICD-10-CM | POA: Diagnosis not present

## 2021-02-03 MED ORDER — DIPHENHYDRAMINE HCL 25 MG PO CAPS
25.0000 mg | ORAL_CAPSULE | Freq: Once | ORAL | Status: AC
  Administered 2021-02-03: 25 mg via ORAL
  Filled 2021-02-03: qty 1

## 2021-02-03 MED ORDER — FAMOTIDINE 20 MG PO TABS
20.0000 mg | ORAL_TABLET | Freq: Once | ORAL | Status: AC
  Administered 2021-02-03: 20 mg via ORAL
  Filled 2021-02-03: qty 1

## 2021-02-03 MED ORDER — PREDNISONE 50 MG PO TABS: 50.0000 mg | ORAL_TABLET | Freq: Every day | ORAL | 0 refills | Status: DC

## 2021-02-03 MED ORDER — PREDNISONE 20 MG PO TABS
60.0000 mg | ORAL_TABLET | Freq: Once | ORAL | Status: AC
  Administered 2021-02-03: 60 mg via ORAL
  Filled 2021-02-03: qty 3

## 2021-02-03 NOTE — ED Provider Notes (Signed)
Emergency Medicine Provider Triage Evaluation Note  Anna Gutierrez , a 70 y.o. female  was evaluated in triage.  Pt complains of allergic reaction.  Patient reports last night she was having some pain in her leg so she took a dose of tizanidine and diclofenac.  She reports she is prescribed these medications, but has not taken them in a very long time.  She reports she then laid down to go to sleep but woke up a few hours later feeling very itchy all over.  She reports that her throat felt a bit dry and itchy to do.  She did not have any rash, has not been facial swelling.  No nausea or vomiting.  No chest pain or shortness of breath  Review of Systems  Positive: Itching Negative: Rash, facial swelling, chest pain, shortness of breath, vomiting  Physical Exam  BP (!) 171/85 (BP Location: Left Arm)   Pulse 82   Temp (!) 97.5 F (36.4 C)   Resp 20   SpO2 99%  Gen:   Awake, no distress   Resp:  Normal effort  MSK:   Moves extremities without difficulty  Other:  No rash or hives, no facial swelling, posterior oropharynx clear  Medical Decision Making  Medically screening exam initiated at 3:06 AM.  Appropriate orders placed.  HADESSAH Anna Gutierrez was informed that the remainder of the evaluation will be completed by another provider, this initial triage assessment does not replace that evaluation, and the importance of remaining in the ED until their evaluation is complete.     Dartha Lodge, PA-C 02/03/21 [id number]    Shon Baton, MD 02/03/21 [phone]

## 2021-02-03 NOTE — ED Provider Notes (Signed)
[facility] EMERGENCY DEPARTMENT Provider Note   CSN: [id number] Arrival date & time: 02/03/21  0256     History Chief Complaint  Patient presents with  . Allergic Reaction    Anna Gutierrez is a 70 y.o. female.  The history is provided by the patient.  Allergic Reaction She has history of hypertension, sick sinus syndrome and comes in because of generalized itching which started about 2 AM.  She had taken a dose of diclofenac and tizanidine last night because of leg pain, and had not taken those medicines for quite some time.  She denies any difficulty breathing or swallowing and she was not aware of any rash.  Itching was actually subsiding by the time she got to the emergency department.  She was given a dose of diphenhydramine and itching has completely resolved.  She also got a dose of famotidine and prednisone.   Past Medical History:  Diagnosis Date  . Hypertension   . Symptomatic bradycardia 03/16/2017  . Thyroid goiter    had radioactive iodine for treatment    Patient Active Problem List   Diagnosis Date Noted  . Sick sinus syndrome (HCC) 05/02/2017  . Symptomatic bradycardia 03/16/2017  . Pre-syncope 03/16/2017  . Hypertension 03/16/2017  . Fecal incontinence 05/22/2015  . Rectal prolapse 11/16/2014    Past Surgical History:  Procedure Laterality Date  . CHOLECYSTECTOMY    . LEAD REVISION/REPAIR N/A 05/02/2017   Procedure: Lead Revision/Repair;  Surgeon: Regan Lemming, MD;  Location: [facility] IN[facility]IVE CV [facility];  Service: Cardiovascular;  Laterality: N/A;  . PACEMAKER IMPLANT N/A 03/17/2017   Procedure: Pacemaker Implant;  Surgeon: Regan Lemming, MD;  Location: [facility] INVASIVE CV LAB;  Service: Cardiovascular;  Laterality: N/A;  . RECTAL PROLAPSE REPAIR  2016  . TONSILLECTOMY    . TUBAL LIGATION       OB History   No obstetric history on file.     Family History  Problem Relation Age of Onset  . Renal Disease Mother   . Diabetes  Mother   . COPD Father   . Hypertension Father   . Arthritis Sister   . Rheum arthritis Sister   . Diabetes Maternal Grandmother   . Glaucoma Sister   . Hypertension Sister   . Multiple sclerosis Sister   . Thyroid disease Neg Hx     Social History   Tobacco Use  . Smoking status: Never Smoker  . Smokeless tobacco: Never Used  Vaping Use  . Vaping Use: Never used  Substance Use Topics  . Alcohol use: No  . Drug use: No    Home Medications Prior to Admission medications   Medication Sig Start Date End Date Taking? Authorizing Provider  acetaminophen (TYLENOL) 325 MG tablet Take 1-2 tablets (325-650 mg total) by mouth every 4 (four) hours as needed for mild pain. 03/18/17   Johnson, Clanford L, MD  amLODipine (NORVASC) 10 MG tablet Take 10 mg by mouth daily. 06/27/17   [provider]  cholecalciferol (VITAMIN D) 1000 units tablet Take 1,000 Units by mouth daily.    [provider]  loperamide (IMODIUM) 2 MG capsule Take 6 mg by mouth 4 (four) times daily as needed for diarrhea or loose stools.    [provider]  tiZANidine (ZANAFLEX) 4 MG tablet TAKE 1 TABLET BY MOUTH EVERY 6 HOURS IF NEEDED FOR MUSCLE SPASM Patient taking differently: 4 mg every 6 (six) hours as needed.  07/03/18   Tarry Kos,  MD    Allergies    Lotrel [amlodipine besy-benazepril hcl]  Review of Systems   Review of Systems  All other systems reviewed and are negative.   Physical Exam Updated Vital Signs BP (!) 171/85 (BP Location: Left Arm)   Pulse 82   Temp (!) 97.5 F (36.4 C)   Resp 20   SpO2 99%   Physical Exam Vitals and nursing note reviewed.   70 year old female, resting comfortably and in no acute distress. Vital signs are significant for elevated blood pressure. Oxygen saturation is 99%, which is normal. Head is normocephalic and atraumatic. PERRLA, EOMI. Oropharynx is clear. Neck is nontender and supple without adenopathy or JVD. Back is nontender and  there is no CVA tenderness. Lungs are clear without rales, wheezes, or rhonchi. Chest is nontender. Heart has regular rate and rhythm without murmur. Abdomen is soft, flat, nontender without masses or hepatosplenomegaly and peristalsis is normoactive. Extremities have no cyanosis or edema, full range of motion is present. Skin is warm and dry without rash. Neurologic: Mental status is normal, cranial nerves are intact, there are no motor or sensory deficits.  ED Results / Procedures / Treatments    Procedures Procedures   Medications Ordered in ED Medications  diphenhydrAMINE (BENADRYL) capsule 25 mg (25 mg Oral Given 02/03/21 0334)  famotidine (PEPCID) tablet 20 mg (20 mg Oral Given 02/03/21 0334)  predniSONE (DELTASONE) tablet 60 mg (60 mg Oral Given 02/03/21 0334)    ED Course  I have reviewed the triage vital signs and the nursing notes.  MDM Rules/Calculators/A&P                         Episode of itching which has resolved.  This may have been an allergic reaction to either diclofenac or tizanidine, but most likely was unrelated.  Old records are reviewed, and she has no relevant past visits.  At this point, it seems prudent to continue on prednisone for short burst, and also would be reasonable to use second-generation antihistamines for at least the next week.  Patient is advised to take either loratadine or cetirizine.  Follow-up with primary care provider.  Final Clinical Impression(s) / ED Diagnoses Final diagnoses:  Allergic reaction, initial encounter  Elevated blood pressure reading with diagnosis of hypertension    Rx / DC Orders ED Discharge Orders         Ordered    predniSONE (DELTASONE) 50 MG tablet  Daily        02/03/21 0635           Dione Booze, MD 02/03/21 [phone]

## 2021-02-03 NOTE — ED Triage Notes (Signed)
Pt states that she had a pain in her leg tonight, took two meds that she has not taken in a long time, pt then began itching all over. No hives noted, airway intact

## 2021-02-03 NOTE — Discharge Instructions (Signed)
Take loratadine (Claritin) or cetirizine (Zyrtec) once a day as needed.  Please do not take the diclofenac or tizanidine for the next 2 weeks.  Discuss with your primary care provider whether to try taking them again.

## 2021-02-16 NOTE — Progress Notes (Signed)
Remote pacemaker transmission.   

## 2021-05-01 ENCOUNTER — Ambulatory Visit (INDEPENDENT_AMBULATORY_CARE_PROVIDER_SITE_OTHER): Payer: Medicare PPO

## 2021-05-01 DIAGNOSIS — I495 Sick sinus syndrome: Secondary | ICD-10-CM | POA: Diagnosis not present

## 2021-05-03 LAB — CUP PACEART REMOTE DEVICE CHECK
Battery Remaining Longevity: 135 mo
Battery Voltage: 3.02 V
Brady Statistic AP VP Percent: 0.06 %
Brady Statistic AP VS Percent: 16.01 %
Brady Statistic AS VP Percent: 0.03 %
Brady Statistic AS VS Percent: 83.9 %
Brady Statistic RA Percent Paced: 16.47 %
Brady Statistic RV Percent Paced: 0.09 %
Date Time Interrogation Session: 20220819014429
Implantable Lead Implant Date: 20180705
Implantable Lead Implant Date: 20180705
Implantable Lead Location: 753859
Implantable Lead Location: 753860
Implantable Lead Model: 5076
Implantable Lead Model: 5076
Implantable Pulse Generator Implant Date: 20180705
Lead Channel Impedance Value: 380 Ohm
Lead Channel Impedance Value: 380 Ohm
Lead Channel Impedance Value: 437 Ohm
Lead Channel Impedance Value: 532 Ohm
Lead Channel Pacing Threshold Amplitude: 0.625 V
Lead Channel Pacing Threshold Amplitude: 0.875 V
Lead Channel Pacing Threshold Pulse Width: 0.4 ms
Lead Channel Pacing Threshold Pulse Width: 0.4 ms
Lead Channel Sensing Intrinsic Amplitude: 2.375 mV
Lead Channel Sensing Intrinsic Amplitude: 2.375 mV
Lead Channel Sensing Intrinsic Amplitude: 7.875 mV
Lead Channel Sensing Intrinsic Amplitude: 7.875 mV
Lead Channel Setting Pacing Amplitude: 2 V
Lead Channel Setting Pacing Amplitude: 2.5 V
Lead Channel Setting Pacing Pulse Width: 0.4 ms
Lead Channel Setting Sensing Sensitivity: 2.8 mV

## 2021-05-19 ENCOUNTER — Other Ambulatory Visit: Payer: Self-pay | Admitting: Internal Medicine

## 2021-05-19 DIAGNOSIS — Z1231 Encounter for screening mammogram for malignant neoplasm of breast: Secondary | ICD-10-CM

## 2021-05-19 NOTE — Progress Notes (Signed)
Remote pacemaker transmission.   

## 2021-06-24 ENCOUNTER — Other Ambulatory Visit: Payer: Self-pay

## 2021-06-24 ENCOUNTER — Ambulatory Visit
Admission: RE | Admit: 2021-06-24 | Discharge: 2021-06-24 | Disposition: A | Discharge status: Home / Self Care | Payer: Medicare PPO | Source: Ambulatory Visit

## 2021-06-24 DIAGNOSIS — Z1231 Encounter for screening mammogram for malignant neoplasm of breast: Secondary | ICD-10-CM

## 2021-07-31 ENCOUNTER — Ambulatory Visit (INDEPENDENT_AMBULATORY_CARE_PROVIDER_SITE_OTHER): Payer: Medicare PPO

## 2021-07-31 DIAGNOSIS — I495 Sick sinus syndrome: Secondary | ICD-10-CM | POA: Diagnosis not present

## 2021-07-31 LAB — CUP PACEART REMOTE DEVICE CHECK
Battery Remaining Longevity: 132 mo
Battery Voltage: 3.02 V
Brady Statistic AP VP Percent: 0.04 %
Brady Statistic AP VS Percent: 15.91 %
Brady Statistic AS VP Percent: 0.03 %
Brady Statistic AS VS Percent: 84.02 %
Brady Statistic RA Percent Paced: 16.39 %
Brady Statistic RV Percent Paced: 0.07 %
Date Time Interrogation Session: 20221117211240
Implantable Lead Implant Date: 20180705
Implantable Lead Implant Date: 20180705
Implantable Lead Location: 753859
Implantable Lead Location: 753860
Implantable Lead Model: 5076
Implantable Lead Model: 5076
Implantable Pulse Generator Implant Date: 20180705
Lead Channel Impedance Value: 399 Ohm
Lead Channel Impedance Value: 418 Ohm
Lead Channel Impedance Value: 437 Ohm
Lead Channel Impedance Value: 513 Ohm
Lead Channel Pacing Threshold Amplitude: 0.625 V
Lead Channel Pacing Threshold Amplitude: 1 V
Lead Channel Pacing Threshold Pulse Width: 0.4 ms
Lead Channel Pacing Threshold Pulse Width: 0.4 ms
Lead Channel Sensing Intrinsic Amplitude: 2.875 mV
Lead Channel Sensing Intrinsic Amplitude: 2.875 mV
Lead Channel Sensing Intrinsic Amplitude: 7.875 mV
Lead Channel Sensing Intrinsic Amplitude: 7.875 mV
Lead Channel Setting Pacing Amplitude: 2 V
Lead Channel Setting Pacing Amplitude: 2.5 V
Lead Channel Setting Pacing Pulse Width: 0.4 ms
Lead Channel Setting Sensing Sensitivity: 2.8 mV

## 2021-08-10 NOTE — Progress Notes (Signed)
Remote pacemaker transmission.   

## 2021-10-30 ENCOUNTER — Ambulatory Visit (INDEPENDENT_AMBULATORY_CARE_PROVIDER_SITE_OTHER): Payer: Medicare PPO

## 2021-10-30 DIAGNOSIS — I495 Sick sinus syndrome: Secondary | ICD-10-CM | POA: Diagnosis not present

## 2021-10-30 LAB — CUP PACEART REMOTE DEVICE CHECK
Battery Remaining Longevity: 129 mo
Battery Voltage: 3.01 V
Brady Statistic AP VP Percent: 0.02 %
Brady Statistic AP VS Percent: 9.51 %
Brady Statistic AS VP Percent: 0.04 %
Brady Statistic AS VS Percent: 90.43 %
Brady Statistic RA Percent Paced: 9.78 %
Brady Statistic RV Percent Paced: 0.06 %
Date Time Interrogation Session: 20230216203719
Implantable Lead Implant Date: 20180705
Implantable Lead Implant Date: 20180705
Implantable Lead Location: 753859
Implantable Lead Location: 753860
Implantable Lead Model: 5076
Implantable Lead Model: 5076
Implantable Pulse Generator Implant Date: 20180705
Lead Channel Impedance Value: 361 Ohm
Lead Channel Impedance Value: 380 Ohm
Lead Channel Impedance Value: 418 Ohm
Lead Channel Impedance Value: 494 Ohm
Lead Channel Pacing Threshold Amplitude: 0.625 V
Lead Channel Pacing Threshold Amplitude: 1 V
Lead Channel Pacing Threshold Pulse Width: 0.4 ms
Lead Channel Pacing Threshold Pulse Width: 0.4 ms
Lead Channel Sensing Intrinsic Amplitude: 2.5 mV
Lead Channel Sensing Intrinsic Amplitude: 2.5 mV
Lead Channel Sensing Intrinsic Amplitude: 7.125 mV
Lead Channel Sensing Intrinsic Amplitude: 7.125 mV
Lead Channel Setting Pacing Amplitude: 2 V
Lead Channel Setting Pacing Amplitude: 2.5 V
Lead Channel Setting Pacing Pulse Width: 0.4 ms
Lead Channel Setting Sensing Sensitivity: 2.8 mV

## 2021-11-05 NOTE — Progress Notes (Signed)
Remote pacemaker transmission.   

## 2021-11-09 IMAGING — MG MM DIGITAL SCREENING BILAT W/ TOMO AND CAD
6 of 9 series · 6 of 25 positions shown · non-contrast
Comparison: Previous exam(s).

ACR Breast Density Category a: The breast tissue is almost entirely
fatty.

CLINICAL DATA: Screening.

EXAM:
DIGITAL SCREENING BILATERAL MAMMOGRAM WITH TOMOSYNTHESIS AND CAD
TECHNIQUE: Bilateral screening digital craniocaudal and mediolateral oblique
mammograms were obtained. Bilateral screening digital breast
tomosynthesis was performed. The images were evaluated with
computer-aided detection.

[L MLO]
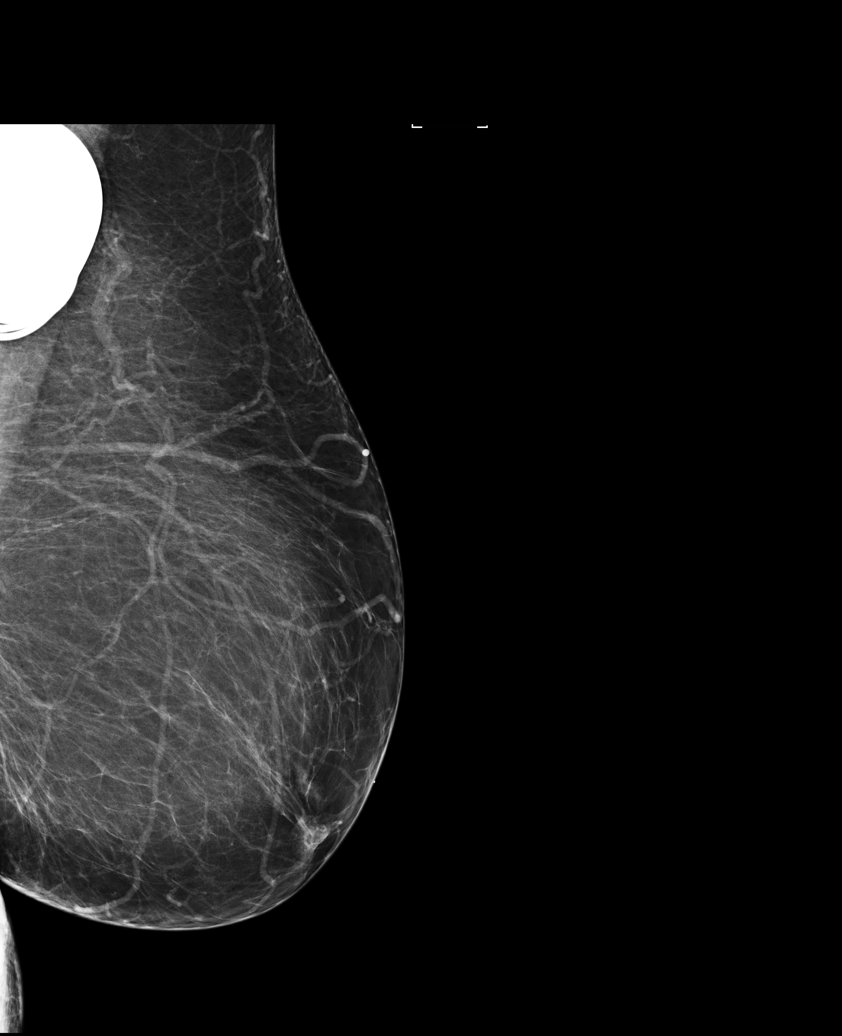

[L MLO synth-2D]
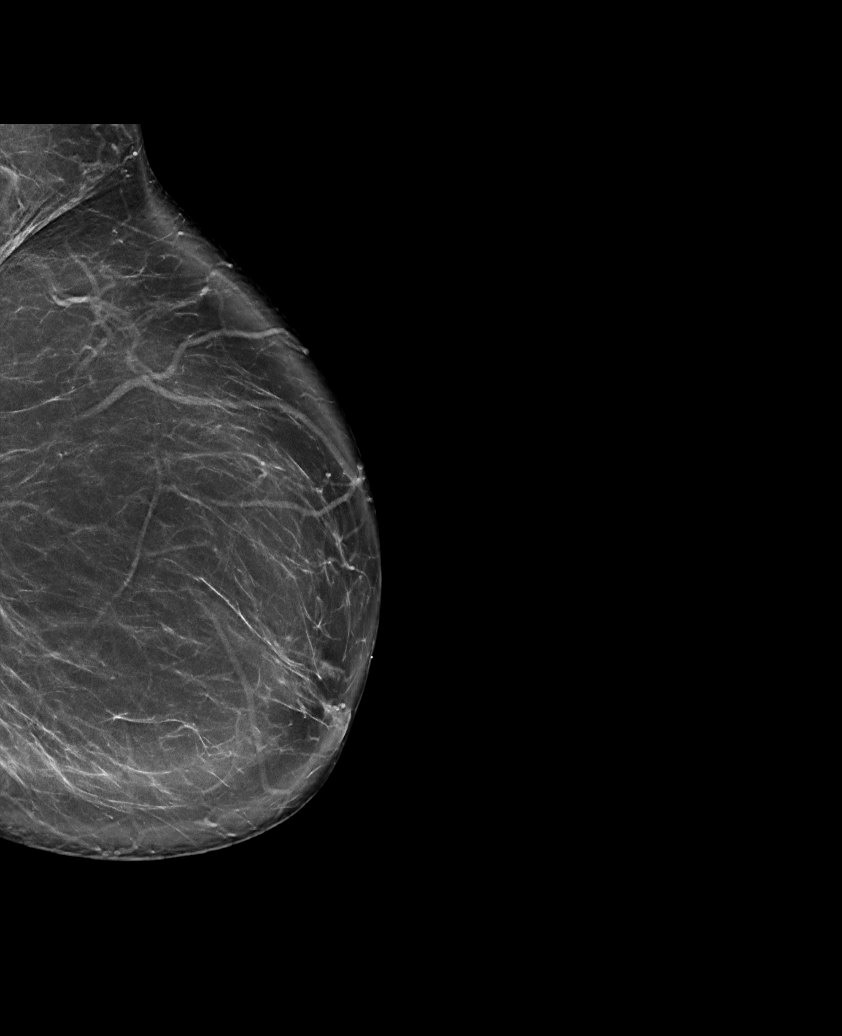

[L CC synth-2D]
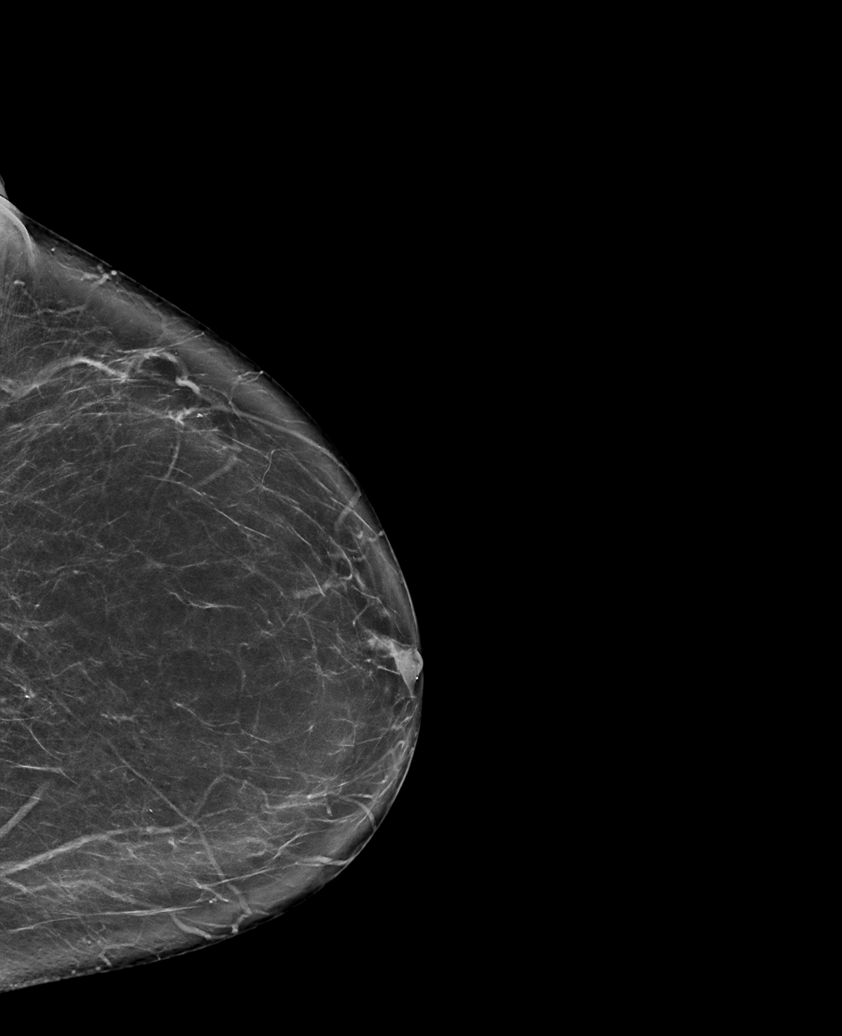

[R MLO synth-2D]
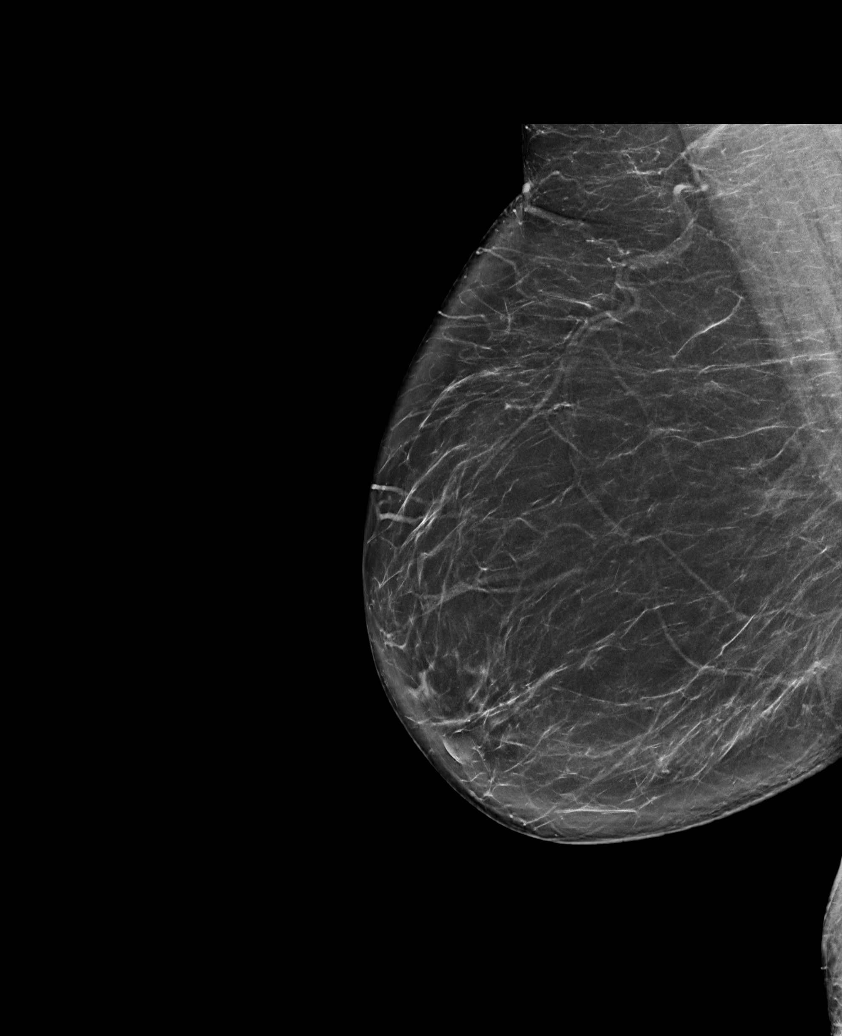

[R CC synth-2D]
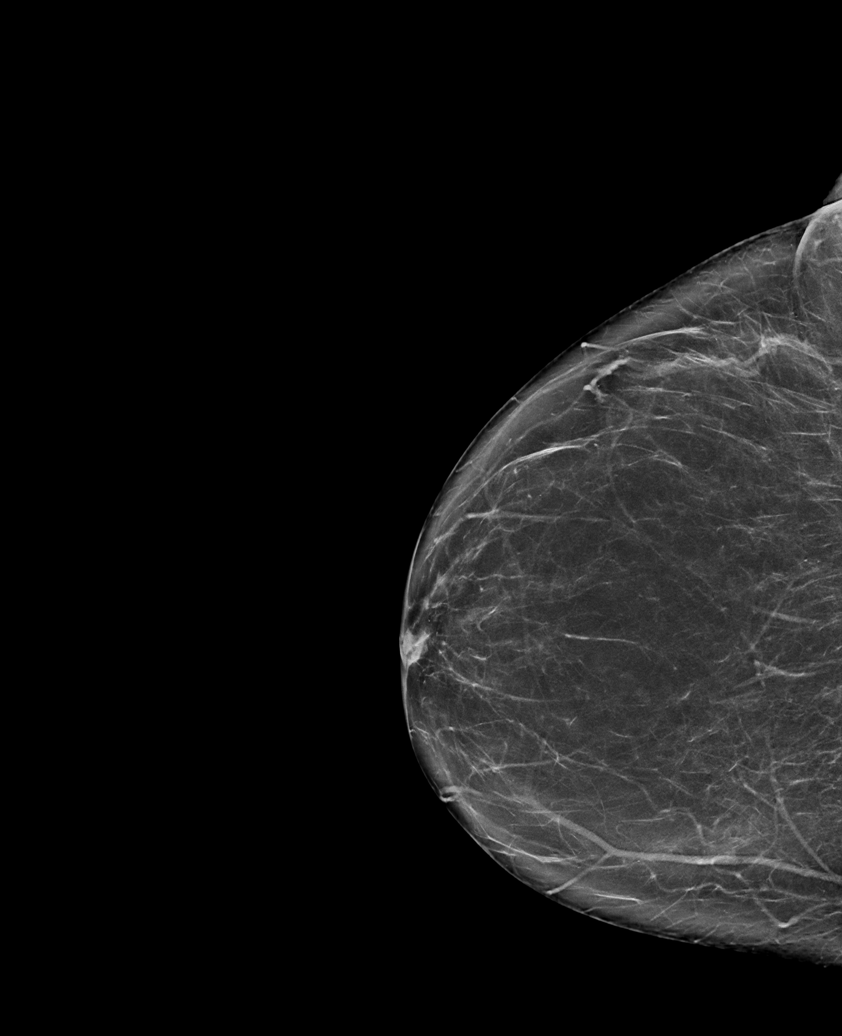

[R CC tomo · tomo slice 40/79.0]
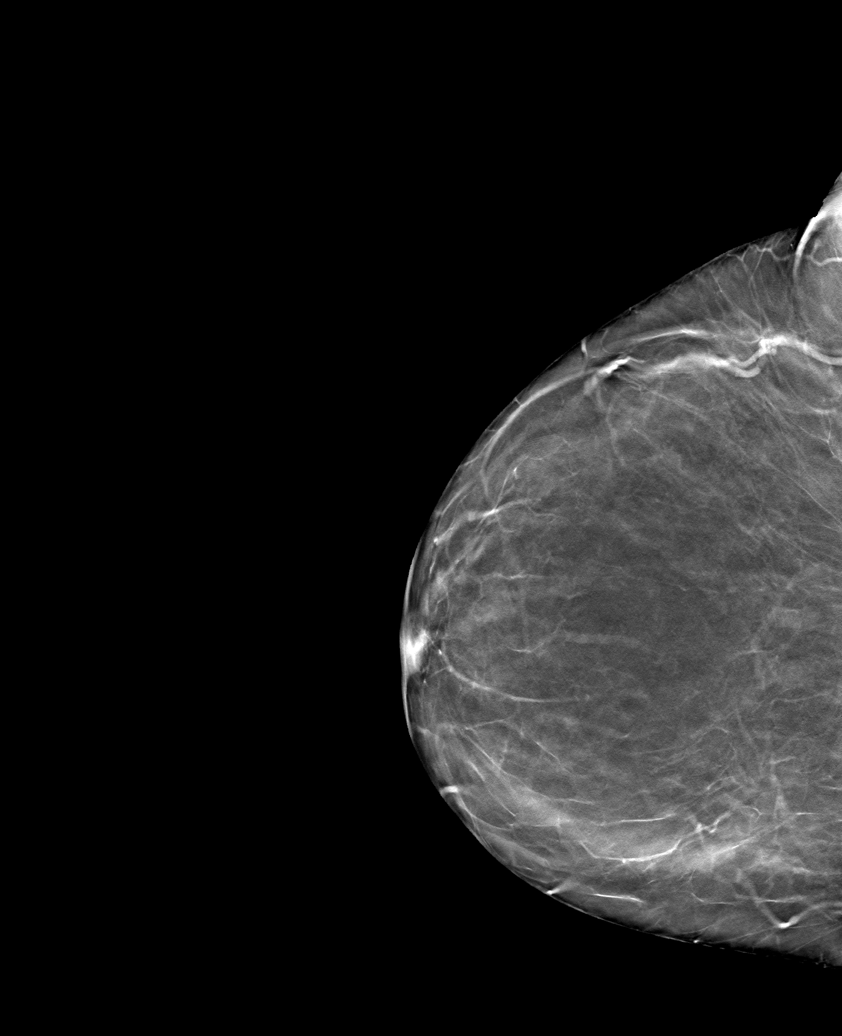

[6 of 25 positions shown; findings below may reference images not displayed]

FINDINGS: There are no findings suspicious for malignancy.
IMPRESSION: No mammographic evidence of malignancy. A result letter of this
screening mammogram will be mailed directly to the patient.

RECOMMENDATION:
Screening mammogram in one year. (Code:0E-3-N98)

BI-RADS CATEGORY  1: Negative.

## 2022-01-29 ENCOUNTER — Ambulatory Visit (INDEPENDENT_AMBULATORY_CARE_PROVIDER_SITE_OTHER): Payer: Medicare PPO

## 2022-01-29 DIAGNOSIS — I495 Sick sinus syndrome: Secondary | ICD-10-CM | POA: Diagnosis not present

## 2022-01-31 LAB — CUP PACEART REMOTE DEVICE CHECK
Battery Remaining Longevity: 125 mo
Battery Voltage: 3.01 V
Brady Statistic AP VP Percent: 0.03 %
Brady Statistic AP VS Percent: 19.26 %
Brady Statistic AS VP Percent: 0.03 %
Brady Statistic AS VS Percent: 80.68 %
Brady Statistic RA Percent Paced: 19.54 %
Brady Statistic RV Percent Paced: 0.06 %
Date Time Interrogation Session: 20230519103928
Implantable Lead Implant Date: 20180705
Implantable Lead Implant Date: 20180705
Implantable Lead Location: 753859
Implantable Lead Location: 753860
Implantable Lead Model: 5076
Implantable Lead Model: 5076
Implantable Pulse Generator Implant Date: 20180705
Lead Channel Impedance Value: 361 Ohm
Lead Channel Impedance Value: 380 Ohm
Lead Channel Impedance Value: 418 Ohm
Lead Channel Impedance Value: 456 Ohm
Lead Channel Pacing Threshold Amplitude: 0.625 V
Lead Channel Pacing Threshold Amplitude: 0.875 V
Lead Channel Pacing Threshold Pulse Width: 0.4 ms
Lead Channel Pacing Threshold Pulse Width: 0.4 ms
Lead Channel Sensing Intrinsic Amplitude: 2.125 mV
Lead Channel Sensing Intrinsic Amplitude: 2.125 mV
Lead Channel Sensing Intrinsic Amplitude: 8.375 mV
Lead Channel Sensing Intrinsic Amplitude: 8.375 mV
Lead Channel Setting Pacing Amplitude: 2 V
Lead Channel Setting Pacing Amplitude: 2.5 V
Lead Channel Setting Pacing Pulse Width: 0.4 ms
Lead Channel Setting Sensing Sensitivity: 2.8 mV

## 2022-02-04 NOTE — Progress Notes (Signed)
Remote pacemaker transmission.   

## 2022-03-17 ENCOUNTER — Telehealth: Payer: Self-pay | Admitting: Cardiology

## 2022-03-17 NOTE — Telephone Encounter (Signed)
Returned call to Pt.  She advised that she thinks she is hearing a low buzzing sound coming for her pacemaker.    She sent a remote transmission that was normal.  Pt has not been seen in clinic since 2018.  Scheduled overdue f/u with AT.  Pt indicates understanding and thanked nurse for call back.

## 2022-03-17 NOTE — Telephone Encounter (Signed)
Patient called stating her pacemaker is making a sizzling noise.

## 2022-03-22 NOTE — Progress Notes (Signed)
Electrophysiology Office Note Date: 03/22/2022  ID:  CAMALA TALWAR, DOB 01/30/1951, MRN [id number]  PCP: Ralene Ok, MD Primary Cardiologist: None Electrophysiologist: Dr. Elberta Fortis  CC: Pacemaker follow-up  Anna Gutierrez is a 71 y.o. female seen today for Anna Jorja Loa, MD for routine electrophysiology followup after long absence from clinic, though remote monitoring has continued.  Since last being seen in our clinic the patient reports doing very well. She has had a buzzing in her ear for the past several weeks that initially she thought was her device, but now thinks is tinnitus.  Sees PCP next month.  she denies chest pain, palpitations, dyspnea, PND, orthopnea, nausea, vomiting, dizziness, syncope, edema, weight gain, or early satiety.  Device History: Medtronic Dual Chamber PPM implanted 03/17/2017, lead revision 04/2017 for SSS.  Past Medical History:  Diagnosis Date   Hypertension    Symptomatic bradycardia 03/16/2017   Thyroid goiter    had radioactive iodine for treatment   Past Surgical History:  Procedure Laterality Date   CHOLECYSTECTOMY     LEAD REVISION/REPAIR N/A 05/02/2017   Procedure: Lead Revision/Repair;  Surgeon: Regan Lemming, MD;  Location: [facility] INVASIVE CV LAB;  Service: Cardiovascular;  Laterality: N/A;   PACEMAKER IMPLANT N/A 03/17/2017   Procedure: Pacemaker Implant;  Surgeon: Regan Lemming, MD;  Location: [facility] INVAS[facility] CV LAB;  Service: Cardiovascular;  Laterality: N/A;   RECTAL PROLAPSE REPAIR  2016   TONSILLECTOMY     TUBAL LIGATION      Current Outpatient Medications  Medication Sig Dispense Refill   acetaminophen (TYLENOL) 325 MG tablet Take 1-2 tablets (325-650 mg total) by mouth every 4 (four) hours as needed for mild pain.     amLODipine (NORVASC) 10 MG tablet Take 10 mg by mouth daily.  0   cholecalciferol (VITAMIN D) 1000 units tablet Take 1,000 Units by mouth daily.     loperamide (IMODIUM) 2 MG capsule Take 6 mg by  mouth 4 (four) times daily as needed for diarrhea or loose stools.     predniSONE (DELTASONE) 50 MG tablet Take 1 tablet (50 mg total) by mouth daily. 5 tablet 0   tiZANidine (ZANAFLEX) 4 MG tablet TAKE 1 TABLET BY MOUTH EVERY 6 HOURS IF NEEDED FOR MUSCLE SPASM (Patient taking differently: 4 mg every 6 (six) hours as needed. ) 30 tablet 0   No current facility-administered medications for this visit.    Allergies:   Lotrel [amlodipine besy-benazepril hcl]   Social History: Social History   Socioeconomic History   Marital status: Divorced    Spouse name: Not on file   Number of children: Not on file   Years of education: Not on file   Highest education level: Not on file  Occupational History   Not on file  Tobacco Use   Smoking status: Never   Smokeless tobacco: Never  Vaping Use   Vaping Use: Never used  Substance and Sexual Activity   Alcohol use: No   Drug use: No   Sexual activity: Not on file  Other Topics Concern   Not on file  Social History Narrative   Not on file   Social Determinants of Health   Financial Resource Strain: Not on file  Food Insecurity: Not on file  Transportation Needs: Not on file  Physical Activity: Not on file  Stress: Not on file  Social Connections: Not on file  Intimate Partner Violence: Not on file    Family History: Family History  Problem  Relation Age of Onset   Renal Disease Mother    Diabetes Mother    COPD Father    Hypertension Father    Arthritis Sister    Rheum arthritis Sister    Diabetes Maternal Grandmother    Glaucoma Sister    Hypertension Sister    Multiple sclerosis Sister    Thyroid disease Neg Hx      Review of Systems: All other systems reviewed and are otherwise negative except as noted above.  Physical Exam: There were no vitals filed for this visit.   GEN- The patient is well appearing, alert and oriented x 3 today.   HEENT: normocephalic, atraumatic; sclera clear, conjunctiva pink; hearing  intact; oropharynx clear; neck supple  Lungs- Clear to ausculation bilaterally, normal work of breathing.  No wheezes, rales, rhonchi Heart- Regular rate and rhythm, no murmurs, rubs or gallops  GI- soft, non-tender, non-distended, bowel sounds present  Extremities- no clubbing or cyanosis. No edema MS- no significant deformity or atrophy Skin- warm and dry, no rash or lesion; PPM pocket well healed Psych- euthymic mood, full affect Neuro- strength and sensation are intact  PPM Interrogation- reviewed in detail today,  See PACEART report  EKG:  EKG is ordered today. Personal review of ekg ordered today shows NSR at 63 bpm   Recent Labs: No results found for requested labs within last 365 days.   Wt Readings from Last 3 Encounters:  06/14/19 169 lb (76.7 kg)  10/27/18 168 lb (76.2 kg)  09/14/18 170 lb 12.8 oz (77.5 kg)     Other studies Reviewed: Additional studies/ records that were reviewed today include: Previous EP office notes, Previous remote checks, Most recent labwork.   Assessment and Plan:  1. Sick sinus syndrome s/p Medtronic PPM  Normal PPM function See Pace Art report No changes today  2. HTN Stable on current regimen   3. Tinnitus PCP follow up.   Current medicines are reviewed at length with the patient today.     Disposition:   Follow up with Dr. Elberta Fortis in 12 months    Signed, Graciella Freer, PA-C  03/22/2022 12:26 PM  [facility] [address] [place] [address] [phone] (office) [phone] (fax)

## 2022-03-29 ENCOUNTER — Ambulatory Visit (INDEPENDENT_AMBULATORY_CARE_PROVIDER_SITE_OTHER): Payer: Medicare PPO | Admitting: Student

## 2022-03-29 VITALS — BP 132/78 | HR 69 | Ht 65.0 in | Wt 172.0 lb

## 2022-03-29 DIAGNOSIS — I495 Sick sinus syndrome: Secondary | ICD-10-CM | POA: Diagnosis not present

## 2022-03-29 DIAGNOSIS — I10 Essential (primary) hypertension: Secondary | ICD-10-CM

## 2022-03-29 LAB — CUP PACEART INCLINIC DEVICE CHECK
Battery Remaining Longevity: 124 mo
Battery Voltage: 3.01 V
Brady Statistic AP VP Percent: 0.04 %
Brady Statistic AP VS Percent: 8.17 %
Brady Statistic AS VP Percent: 0.04 %
Brady Statistic AS VS Percent: 91.76 %
Brady Statistic RA Percent Paced: 8.49 %
Brady Statistic RV Percent Paced: 0.07 %
Date Time Interrogation Session: 20230717104204
Implantable Lead Implant Date: 20180705
Implantable Lead Implant Date: 20180705
Implantable Lead Location: 753859
Implantable Lead Location: 753860
Implantable Lead Model: 5076
Implantable Lead Model: 5076
Implantable Pulse Generator Implant Date: 20180705
Lead Channel Impedance Value: 399 Ohm
Lead Channel Impedance Value: 418 Ohm
Lead Channel Impedance Value: 475 Ohm
Lead Channel Impedance Value: 513 Ohm
Lead Channel Pacing Threshold Amplitude: 0.625 V
Lead Channel Pacing Threshold Amplitude: 1 V
Lead Channel Pacing Threshold Pulse Width: 0.4 ms
Lead Channel Pacing Threshold Pulse Width: 0.4 ms
Lead Channel Sensing Intrinsic Amplitude: 1.5 mV
Lead Channel Sensing Intrinsic Amplitude: 2.625 mV
Lead Channel Sensing Intrinsic Amplitude: 8.25 mV
Lead Channel Sensing Intrinsic Amplitude: 9.125 mV
Lead Channel Setting Pacing Amplitude: 2 V
Lead Channel Setting Pacing Amplitude: 2.5 V
Lead Channel Setting Pacing Pulse Width: 0.4 ms
Lead Channel Setting Sensing Sensitivity: 2.8 mV

## 2022-03-29 NOTE — Patient Instructions (Signed)
Medication Instructions:  Your physician recommends that you continue on your current medications as directed. Please refer to the Current Medication list given to you today.  *If you need a refill on your cardiac medications before your next appointment, please call your pharmacy*   Lab Work: None If you have labs (blood work) drawn today and your tests are completely normal, you will receive your results only by: MyChart Message (if you have MyChart) OR A paper copy in the mail If you have any lab test that is abnormal or we need to change your treatment, we will call you to review the results.   Follow-Up: At [facility], you and your health needs are our priority.  As part of our continuing mission to provide you with exceptional heart care, we have created designated Provider Care Teams.  These Care Teams include your primary Cardiologist (physician) and Advanced Practice Providers (APPs -  Physician Assistants and Nurse Practitioners) who all work together to provide you with the care you need, when you need it.   Your next appointment:   1 year(s)  The format for your next appointment:   In Person  Provider:   Will Camnitz, MD{   

## 2022-04-30 ENCOUNTER — Ambulatory Visit (INDEPENDENT_AMBULATORY_CARE_PROVIDER_SITE_OTHER): Payer: Medicare PPO

## 2022-04-30 DIAGNOSIS — I495 Sick sinus syndrome: Secondary | ICD-10-CM

## 2022-05-03 LAB — CUP PACEART REMOTE DEVICE CHECK
Battery Remaining Longevity: 123 mo
Battery Voltage: 3.01 V
Brady Statistic AP VP Percent: 0.03 %
Brady Statistic AP VS Percent: 22.43 %
Brady Statistic AS VP Percent: 0.02 %
Brady Statistic AS VS Percent: 77.52 %
Brady Statistic RA Percent Paced: 22.79 %
Brady Statistic RV Percent Paced: 0.05 %
Date Time Interrogation Session: 20230818162801
Implantable Lead Implant Date: 20180705
Implantable Lead Implant Date: 20180705
Implantable Lead Location: 753859
Implantable Lead Location: 753860
Implantable Lead Model: 5076
Implantable Lead Model: 5076
Implantable Pulse Generator Implant Date: 20180705
Lead Channel Impedance Value: 380 Ohm
Lead Channel Impedance Value: 399 Ohm
Lead Channel Impedance Value: 437 Ohm
Lead Channel Impedance Value: 513 Ohm
Lead Channel Pacing Threshold Amplitude: 0.625 V
Lead Channel Pacing Threshold Amplitude: 0.875 V
Lead Channel Pacing Threshold Pulse Width: 0.4 ms
Lead Channel Pacing Threshold Pulse Width: 0.4 ms
Lead Channel Sensing Intrinsic Amplitude: 3.125 mV
Lead Channel Sensing Intrinsic Amplitude: 3.125 mV
Lead Channel Sensing Intrinsic Amplitude: 7.375 mV
Lead Channel Sensing Intrinsic Amplitude: 7.375 mV
Lead Channel Setting Pacing Amplitude: 2 V
Lead Channel Setting Pacing Amplitude: 2.5 V
Lead Channel Setting Pacing Pulse Width: 0.4 ms
Lead Channel Setting Sensing Sensitivity: 2.8 mV

## 2022-05-11 ENCOUNTER — Other Ambulatory Visit: Payer: Self-pay | Admitting: Internal Medicine

## 2022-05-11 DIAGNOSIS — Z1231 Encounter for screening mammogram for malignant neoplasm of breast: Secondary | ICD-10-CM

## 2022-05-26 NOTE — Progress Notes (Signed)
Remote pacemaker transmission.   

## 2022-06-25 ENCOUNTER — Ambulatory Visit
Admission: RE | Admit: 2022-06-25 | Discharge: 2022-06-25 | Disposition: A | Discharge status: Home / Self Care | Payer: Medicare PPO | Source: Ambulatory Visit | Attending: Internal Medicine | Admitting: Internal Medicine

## 2022-06-25 DIAGNOSIS — Z1231 Encounter for screening mammogram for malignant neoplasm of breast: Secondary | ICD-10-CM

## 2022-07-30 ENCOUNTER — Ambulatory Visit (INDEPENDENT_AMBULATORY_CARE_PROVIDER_SITE_OTHER): Payer: Medicare PPO

## 2022-07-30 DIAGNOSIS — Z950 Presence of cardiac pacemaker: Secondary | ICD-10-CM

## 2022-07-30 DIAGNOSIS — I495 Sick sinus syndrome: Secondary | ICD-10-CM

## 2022-07-30 LAB — CUP PACEART REMOTE DEVICE CHECK
Battery Remaining Longevity: 120 mo
Battery Voltage: 3.01 V
Brady Statistic AP VP Percent: 0.03 %
Brady Statistic AP VS Percent: 19.15 %
Brady Statistic AS VP Percent: 0.03 %
Brady Statistic AS VS Percent: 80.8 %
Brady Statistic RA Percent Paced: 19.38 %
Brady Statistic RV Percent Paced: 0.05 %
Date Time Interrogation Session: 20231116212057
Implantable Lead Connection Status: 753985
Implantable Lead Connection Status: 753985
Implantable Lead Implant Date: 20180705
Implantable Lead Implant Date: 20180705
Implantable Lead Location: 753859
Implantable Lead Location: 753860
Implantable Lead Model: 5076
Implantable Lead Model: 5076
Implantable Pulse Generator Implant Date: 20180705
Lead Channel Impedance Value: 380 Ohm
Lead Channel Impedance Value: 380 Ohm
Lead Channel Impedance Value: 437 Ohm
Lead Channel Impedance Value: 475 Ohm
Lead Channel Pacing Threshold Amplitude: 0.5 V
Lead Channel Pacing Threshold Amplitude: 0.875 V
Lead Channel Pacing Threshold Pulse Width: 0.4 ms
Lead Channel Pacing Threshold Pulse Width: 0.4 ms
Lead Channel Sensing Intrinsic Amplitude: 2.625 mV
Lead Channel Sensing Intrinsic Amplitude: 2.625 mV
Lead Channel Sensing Intrinsic Amplitude: 7.875 mV
Lead Channel Sensing Intrinsic Amplitude: 7.875 mV
Lead Channel Setting Pacing Amplitude: 2 V
Lead Channel Setting Pacing Amplitude: 2.5 V
Lead Channel Setting Pacing Pulse Width: 0.4 ms
Lead Channel Setting Sensing Sensitivity: 2.8 mV
Zone Setting Status: 755011
Zone Setting Status: 755011

## 2022-08-17 NOTE — Progress Notes (Signed)
Remote pacemaker transmission.   

## 2022-10-29 ENCOUNTER — Ambulatory Visit (INDEPENDENT_AMBULATORY_CARE_PROVIDER_SITE_OTHER): Payer: Medicare PPO

## 2022-10-29 DIAGNOSIS — I495 Sick sinus syndrome: Secondary | ICD-10-CM | POA: Diagnosis not present

## 2022-10-29 LAB — CUP PACEART REMOTE DEVICE CHECK
Battery Remaining Longevity: 118 mo
Battery Voltage: 3.01 V
Brady Statistic AP VP Percent: 0.03 %
Brady Statistic AP VS Percent: 20.61 %
Brady Statistic AS VP Percent: 0.03 %
Brady Statistic AS VS Percent: 79.33 %
Brady Statistic RA Percent Paced: 20.91 %
Brady Statistic RV Percent Paced: 0.06 %
Date Time Interrogation Session: 20240214200452
Implantable Lead Connection Status: 753985
Implantable Lead Connection Status: 753985
Implantable Lead Implant Date: 20180705
Implantable Lead Implant Date: 20180705
Implantable Lead Location: 753859
Implantable Lead Location: 753860
Implantable Lead Model: 5076
Implantable Lead Model: 5076
Implantable Pulse Generator Implant Date: 20180705
Lead Channel Impedance Value: 361 Ohm
Lead Channel Impedance Value: 361 Ohm
Lead Channel Impedance Value: 399 Ohm
Lead Channel Impedance Value: 494 Ohm
Lead Channel Pacing Threshold Amplitude: 0.625 V
Lead Channel Pacing Threshold Amplitude: 1 V
Lead Channel Pacing Threshold Pulse Width: 0.4 ms
Lead Channel Pacing Threshold Pulse Width: 0.4 ms
Lead Channel Sensing Intrinsic Amplitude: 2.375 mV
Lead Channel Sensing Intrinsic Amplitude: 2.375 mV
Lead Channel Sensing Intrinsic Amplitude: 7.125 mV
Lead Channel Sensing Intrinsic Amplitude: 7.125 mV
Lead Channel Setting Pacing Amplitude: 2 V
Lead Channel Setting Pacing Amplitude: 2.5 V
Lead Channel Setting Pacing Pulse Width: 0.4 ms
Lead Channel Setting Sensing Sensitivity: 2.8 mV
Zone Setting Status: 755011
Zone Setting Status: 755011

## 2022-11-23 NOTE — Progress Notes (Signed)
Remote pacemaker transmission.   

## 2023-01-28 ENCOUNTER — Ambulatory Visit (INDEPENDENT_AMBULATORY_CARE_PROVIDER_SITE_OTHER): Payer: Medicare PPO

## 2023-01-28 DIAGNOSIS — I495 Sick sinus syndrome: Secondary | ICD-10-CM | POA: Diagnosis not present

## 2023-01-28 LAB — CUP PACEART REMOTE DEVICE CHECK
Battery Remaining Longevity: 116 mo
Battery Voltage: 3 V
Brady Statistic AP VP Percent: 0.04 %
Brady Statistic AP VS Percent: 19.45 %
Brady Statistic AS VP Percent: 0.03 %
Brady Statistic AS VS Percent: 80.48 %
Brady Statistic RA Percent Paced: 19.75 %
Brady Statistic RV Percent Paced: 0.07 %
Date Time Interrogation Session: 20240517054707
Implantable Lead Connection Status: 753985
Implantable Lead Connection Status: 753985
Implantable Lead Implant Date: 20180705
Implantable Lead Implant Date: 20180705
Implantable Lead Location: 753859
Implantable Lead Location: 753860
Implantable Lead Model: 5076
Implantable Lead Model: 5076
Implantable Pulse Generator Implant Date: 20180705
Lead Channel Impedance Value: 342 Ohm
Lead Channel Impedance Value: 380 Ohm
Lead Channel Impedance Value: 418 Ohm
Lead Channel Impedance Value: 475 Ohm
Lead Channel Pacing Threshold Amplitude: 0.625 V
Lead Channel Pacing Threshold Amplitude: 1 V
Lead Channel Pacing Threshold Pulse Width: 0.4 ms
Lead Channel Pacing Threshold Pulse Width: 0.4 ms
Lead Channel Sensing Intrinsic Amplitude: 0.75 mV
Lead Channel Sensing Intrinsic Amplitude: 0.75 mV
Lead Channel Sensing Intrinsic Amplitude: 7.875 mV
Lead Channel Sensing Intrinsic Amplitude: 7.875 mV
Lead Channel Setting Pacing Amplitude: 2 V
Lead Channel Setting Pacing Amplitude: 2.5 V
Lead Channel Setting Pacing Pulse Width: 0.4 ms
Lead Channel Setting Sensing Sensitivity: 2.8 mV
Zone Setting Status: 755011
Zone Setting Status: 755011

## 2023-02-09 NOTE — Progress Notes (Signed)
Remote pacemaker transmission.   

## 2023-04-29 ENCOUNTER — Ambulatory Visit (INDEPENDENT_AMBULATORY_CARE_PROVIDER_SITE_OTHER): Payer: Medicare PPO

## 2023-04-29 DIAGNOSIS — I495 Sick sinus syndrome: Secondary | ICD-10-CM

## 2023-04-29 LAB — CUP PACEART REMOTE DEVICE CHECK
Battery Remaining Longevity: 114 mo
Battery Voltage: 3 V
Brady Statistic AP VP Percent: 0.06 %
Brady Statistic AP VS Percent: 28.01 %
Brady Statistic AS VP Percent: 0.02 %
Brady Statistic AS VS Percent: 71.92 %
Brady Statistic RA Percent Paced: 28.35 %
Brady Statistic RV Percent Paced: 0.08 %
Date Time Interrogation Session: 20240816034401
Implantable Lead Connection Status: 753985
Implantable Lead Connection Status: 753985
Implantable Lead Implant Date: 20180705
Implantable Lead Implant Date: 20180705
Implantable Lead Location: 753859
Implantable Lead Location: 753860
Implantable Lead Model: 5076
Implantable Lead Model: 5076
Implantable Pulse Generator Implant Date: 20180705
Lead Channel Impedance Value: 380 Ohm
Lead Channel Impedance Value: 399 Ohm
Lead Channel Impedance Value: 437 Ohm
Lead Channel Impedance Value: 494 Ohm
Lead Channel Pacing Threshold Amplitude: 0.625 V
Lead Channel Pacing Threshold Amplitude: 0.875 V
Lead Channel Pacing Threshold Pulse Width: 0.4 ms
Lead Channel Pacing Threshold Pulse Width: 0.4 ms
Lead Channel Sensing Intrinsic Amplitude: 2.375 mV
Lead Channel Sensing Intrinsic Amplitude: 2.375 mV
Lead Channel Sensing Intrinsic Amplitude: 7.5 mV
Lead Channel Sensing Intrinsic Amplitude: 7.5 mV
Lead Channel Setting Pacing Amplitude: 2 V
Lead Channel Setting Pacing Amplitude: 2.5 V
Lead Channel Setting Pacing Pulse Width: 0.4 ms
Lead Channel Setting Sensing Sensitivity: 2.8 mV
Zone Setting Status: 755011
Zone Setting Status: 755011

## 2023-05-09 NOTE — Progress Notes (Signed)
Remote pacemaker transmission.   

## 2023-07-08 ENCOUNTER — Other Ambulatory Visit: Payer: Self-pay | Admitting: Internal Medicine

## 2023-07-08 DIAGNOSIS — Z1231 Encounter for screening mammogram for malignant neoplasm of breast: Secondary | ICD-10-CM

## 2023-07-12 ENCOUNTER — Inpatient Hospital Stay
Admission: RE | Admit: 2023-07-12 | Discharge: 2023-07-12 | Discharge status: Home / Self Care | Payer: Medicare PPO | Source: Ambulatory Visit | Attending: Internal Medicine | Admitting: Internal Medicine

## 2023-07-12 DIAGNOSIS — Z1231 Encounter for screening mammogram for malignant neoplasm of breast: Secondary | ICD-10-CM

## 2023-07-29 ENCOUNTER — Ambulatory Visit (INDEPENDENT_AMBULATORY_CARE_PROVIDER_SITE_OTHER): Payer: Medicare PPO

## 2023-07-29 DIAGNOSIS — I495 Sick sinus syndrome: Secondary | ICD-10-CM | POA: Diagnosis not present

## 2023-08-02 LAB — CUP PACEART REMOTE DEVICE CHECK
Battery Remaining Longevity: 112 mo
Battery Voltage: 3 V
Brady Statistic AP VP Percent: 0.08 %
Brady Statistic AP VS Percent: 27.61 %
Brady Statistic AS VP Percent: 0.02 %
Brady Statistic AS VS Percent: 72.29 %
Brady Statistic RA Percent Paced: 28.04 %
Brady Statistic RV Percent Paced: 0.1 %
Date Time Interrogation Session: 20241115004941
Implantable Lead Connection Status: 753985
Implantable Lead Connection Status: 753985
Implantable Lead Implant Date: 20180705
Implantable Lead Implant Date: 20180705
Implantable Lead Location: 753859
Implantable Lead Location: 753860
Implantable Lead Model: 5076
Implantable Lead Model: 5076
Implantable Pulse Generator Implant Date: 20180705
Lead Channel Impedance Value: 380 Ohm
Lead Channel Impedance Value: 380 Ohm
Lead Channel Impedance Value: 437 Ohm
Lead Channel Impedance Value: 513 Ohm
Lead Channel Pacing Threshold Amplitude: 0.625 V
Lead Channel Pacing Threshold Amplitude: 0.875 V
Lead Channel Pacing Threshold Pulse Width: 0.4 ms
Lead Channel Pacing Threshold Pulse Width: 0.4 ms
Lead Channel Sensing Intrinsic Amplitude: 2.625 mV
Lead Channel Sensing Intrinsic Amplitude: 2.625 mV
Lead Channel Sensing Intrinsic Amplitude: 7.375 mV
Lead Channel Sensing Intrinsic Amplitude: 7.375 mV
Lead Channel Setting Pacing Amplitude: 2 V
Lead Channel Setting Pacing Amplitude: 2.5 V
Lead Channel Setting Pacing Pulse Width: 0.4 ms
Lead Channel Setting Sensing Sensitivity: 2.8 mV
Zone Setting Status: 755011
Zone Setting Status: 755011

## 2023-08-10 NOTE — Progress Notes (Signed)
Remote pacemaker transmission.   

## 2023-10-28 ENCOUNTER — Ambulatory Visit (INDEPENDENT_AMBULATORY_CARE_PROVIDER_SITE_OTHER): Payer: Medicare PPO

## 2023-10-28 DIAGNOSIS — I495 Sick sinus syndrome: Secondary | ICD-10-CM

## 2023-10-28 LAB — CUP PACEART REMOTE DEVICE CHECK
Battery Remaining Longevity: 110 mo
Battery Voltage: 3 V
Brady Statistic AP VP Percent: 0.05 %
Brady Statistic AP VS Percent: 20.85 %
Brady Statistic AS VP Percent: 0.03 %
Brady Statistic AS VS Percent: 79.06 %
Brady Statistic RA Percent Paced: 21.19 %
Brady Statistic RV Percent Paced: 0.09 %
Date Time Interrogation Session: 20250213215143
Implantable Lead Connection Status: 753985
Implantable Lead Connection Status: 753985
Implantable Lead Implant Date: 20180705
Implantable Lead Implant Date: 20180705
Implantable Lead Location: 753859
Implantable Lead Location: 753860
Implantable Lead Model: 5076
Implantable Lead Model: 5076
Implantable Pulse Generator Implant Date: 20180705
Lead Channel Impedance Value: 361 Ohm
Lead Channel Impedance Value: 361 Ohm
Lead Channel Impedance Value: 399 Ohm
Lead Channel Impedance Value: 475 Ohm
Lead Channel Pacing Threshold Amplitude: 0.625 V
Lead Channel Pacing Threshold Amplitude: 1 V
Lead Channel Pacing Threshold Pulse Width: 0.4 ms
Lead Channel Pacing Threshold Pulse Width: 0.4 ms
Lead Channel Sensing Intrinsic Amplitude: 2.375 mV
Lead Channel Sensing Intrinsic Amplitude: 2.375 mV
Lead Channel Sensing Intrinsic Amplitude: 7.25 mV
Lead Channel Sensing Intrinsic Amplitude: 7.25 mV
Lead Channel Setting Pacing Amplitude: 2 V
Lead Channel Setting Pacing Amplitude: 2.5 V
Lead Channel Setting Pacing Pulse Width: 0.4 ms
Lead Channel Setting Sensing Sensitivity: 2.8 mV
Zone Setting Status: 755011
Zone Setting Status: 755011

## 2023-11-28 NOTE — Progress Notes (Signed)
 Remote pacemaker transmission.

## 2024-01-27 ENCOUNTER — Ambulatory Visit (INDEPENDENT_AMBULATORY_CARE_PROVIDER_SITE_OTHER): Payer: Medicare PPO

## 2024-01-27 DIAGNOSIS — I495 Sick sinus syndrome: Secondary | ICD-10-CM | POA: Diagnosis not present

## 2024-01-27 LAB — CUP PACEART REMOTE DEVICE CHECK
Battery Remaining Longevity: 107 mo
Battery Voltage: 2.99 V
Brady Statistic AP VP Percent: 0.06 %
Brady Statistic AP VS Percent: 23.49 %
Brady Statistic AS VP Percent: 0.03 %
Brady Statistic AS VS Percent: 76.42 %
Brady Statistic RA Percent Paced: 23.82 %
Brady Statistic RV Percent Paced: 0.09 %
Date Time Interrogation Session: 20250515210312
Implantable Lead Connection Status: 753985
Implantable Lead Connection Status: 753985
Implantable Lead Implant Date: 20180705
Implantable Lead Implant Date: 20180705
Implantable Lead Location: 753859
Implantable Lead Location: 753860
Implantable Lead Model: 5076
Implantable Lead Model: 5076
Implantable Pulse Generator Implant Date: 20180705
Lead Channel Impedance Value: 380 Ohm
Lead Channel Impedance Value: 399 Ohm
Lead Channel Impedance Value: 418 Ohm
Lead Channel Impedance Value: 513 Ohm
Lead Channel Pacing Threshold Amplitude: 0.625 V
Lead Channel Pacing Threshold Amplitude: 0.875 V
Lead Channel Pacing Threshold Pulse Width: 0.4 ms
Lead Channel Pacing Threshold Pulse Width: 0.4 ms
Lead Channel Sensing Intrinsic Amplitude: 1.875 mV
Lead Channel Sensing Intrinsic Amplitude: 1.875 mV
Lead Channel Sensing Intrinsic Amplitude: 7.125 mV
Lead Channel Sensing Intrinsic Amplitude: 7.125 mV
Lead Channel Setting Pacing Amplitude: 2 V
Lead Channel Setting Pacing Amplitude: 2.5 V
Lead Channel Setting Pacing Pulse Width: 0.4 ms
Lead Channel Setting Sensing Sensitivity: 2.8 mV
Zone Setting Status: 755011
Zone Setting Status: 755011

## 2024-01-29 ENCOUNTER — Ambulatory Visit: Payer: Self-pay | Admitting: Cardiology

## 2024-03-01 NOTE — Progress Notes (Signed)
 Remote pacemaker transmission.

## 2024-04-27 ENCOUNTER — Ambulatory Visit (INDEPENDENT_AMBULATORY_CARE_PROVIDER_SITE_OTHER): Payer: Medicare PPO

## 2024-04-27 DIAGNOSIS — I495 Sick sinus syndrome: Secondary | ICD-10-CM

## 2024-05-01 LAB — CUP PACEART REMOTE DEVICE CHECK
Battery Remaining Longevity: 105 mo
Battery Voltage: 2.99 V
Brady Statistic AP VP Percent: 0.1 %
Brady Statistic AP VS Percent: 25.5 %
Brady Statistic AS VP Percent: 0.03 %
Brady Statistic AS VS Percent: 74.37 %
Brady Statistic RA Percent Paced: 25.95 %
Brady Statistic RV Percent Paced: 0.13 %
Date Time Interrogation Session: 20250814230428
Implantable Lead Connection Status: 753985
Implantable Lead Connection Status: 753985
Implantable Lead Implant Date: 20180705
Implantable Lead Implant Date: 20180705
Implantable Lead Location: 753859
Implantable Lead Location: 753860
Implantable Lead Model: 5076
Implantable Lead Model: 5076
Implantable Pulse Generator Implant Date: 20180705
Lead Channel Impedance Value: 380 Ohm
Lead Channel Impedance Value: 399 Ohm
Lead Channel Impedance Value: 418 Ohm
Lead Channel Impedance Value: 494 Ohm
Lead Channel Pacing Threshold Amplitude: 0.625 V
Lead Channel Pacing Threshold Amplitude: 0.875 V
Lead Channel Pacing Threshold Pulse Width: 0.4 ms
Lead Channel Pacing Threshold Pulse Width: 0.4 ms
Lead Channel Sensing Intrinsic Amplitude: 2.375 mV
Lead Channel Sensing Intrinsic Amplitude: 2.375 mV
Lead Channel Sensing Intrinsic Amplitude: 6.625 mV
Lead Channel Sensing Intrinsic Amplitude: 6.625 mV
Lead Channel Setting Pacing Amplitude: 2 V
Lead Channel Setting Pacing Amplitude: 2.5 V
Lead Channel Setting Pacing Pulse Width: 0.4 ms
Lead Channel Setting Sensing Sensitivity: 2.8 mV
Zone Setting Status: 755011
Zone Setting Status: 755011

## 2024-05-03 ENCOUNTER — Ambulatory Visit: Payer: Self-pay | Admitting: Cardiology

## 2024-05-29 ENCOUNTER — Other Ambulatory Visit: Payer: Self-pay | Admitting: Internal Medicine

## 2024-05-29 DIAGNOSIS — Z1231 Encounter for screening mammogram for malignant neoplasm of breast: Secondary | ICD-10-CM

## 2024-06-06 NOTE — Progress Notes (Signed)
 Remote PPM Transmission

## 2024-07-13 ENCOUNTER — Ambulatory Visit
Admission: RE | Admit: 2024-07-13 | Discharge: 2024-07-13 | Disposition: A | Discharge status: Home / Self Care | Source: Ambulatory Visit | Attending: Internal Medicine | Admitting: Internal Medicine

## 2024-07-13 DIAGNOSIS — Z1231 Encounter for screening mammogram for malignant neoplasm of breast: Secondary | ICD-10-CM

## 2024-07-27 ENCOUNTER — Ambulatory Visit: Payer: Medicare PPO

## 2024-07-27 DIAGNOSIS — I495 Sick sinus syndrome: Secondary | ICD-10-CM

## 2024-07-29 LAB — CUP PACEART REMOTE DEVICE CHECK
Battery Remaining Longevity: 102 mo
Battery Voltage: 2.99 V
Brady Statistic AP VP Percent: 0.22 %
Brady Statistic AP VS Percent: 24.04 %
Brady Statistic AS VP Percent: 0.04 %
Brady Statistic AS VS Percent: 75.69 %
Brady Statistic RA Percent Paced: 24.71 %
Brady Statistic RV Percent Paced: 0.26 %
Date Time Interrogation Session: 20251114014046
Implantable Lead Connection Status: 753985
Implantable Lead Connection Status: 753985
Implantable Lead Implant Date: 20180705
Implantable Lead Implant Date: 20180705
Implantable Lead Location: 753859
Implantable Lead Location: 753860
Implantable Lead Model: 5076
Implantable Lead Model: 5076
Implantable Pulse Generator Implant Date: 20180705
Lead Channel Impedance Value: 361 Ohm
Lead Channel Impedance Value: 399 Ohm
Lead Channel Impedance Value: 437 Ohm
Lead Channel Impedance Value: 475 Ohm
Lead Channel Pacing Threshold Amplitude: 0.625 V
Lead Channel Pacing Threshold Amplitude: 1 V
Lead Channel Pacing Threshold Pulse Width: 0.4 ms
Lead Channel Pacing Threshold Pulse Width: 0.4 ms
Lead Channel Sensing Intrinsic Amplitude: 2.5 mV
Lead Channel Sensing Intrinsic Amplitude: 2.5 mV
Lead Channel Sensing Intrinsic Amplitude: 7 mV
Lead Channel Sensing Intrinsic Amplitude: 7 mV
Lead Channel Setting Pacing Amplitude: 2 V
Lead Channel Setting Pacing Amplitude: 2.5 V
Lead Channel Setting Pacing Pulse Width: 0.4 ms
Lead Channel Setting Sensing Sensitivity: 2.8 mV
Zone Setting Status: 755011
Zone Setting Status: 755011

## 2024-07-31 ENCOUNTER — Ambulatory Visit: Payer: Self-pay | Admitting: Cardiology

## 2024-07-31 NOTE — Progress Notes (Signed)
 Remote PPM Transmission

## 2024-10-26 ENCOUNTER — Ambulatory Visit

## 2025-01-25 ENCOUNTER — Ambulatory Visit

## 2025-04-26 ENCOUNTER — Ambulatory Visit

## 2025-07-26 ENCOUNTER — Ambulatory Visit

## 2025-10-25 ENCOUNTER — Ambulatory Visit
